# Patient Record
Sex: Female | Born: 1965
Health system: Southern US, Community
[De-identification: ages and names within clinical notes are randomized; demographics above are authoritative.]

## PROBLEM LIST (undated history)

## (undated) HISTORY — PX: THYROIDECTOMY, PARTIAL: SHX18

## (undated) HISTORY — PX: ABDOMINAL HYSTERECTOMY: SHX81

## (undated) HISTORY — PX: CHOLECYSTECTOMY: SHX55

---

## 2009-05-09 HISTORY — PX: BREAST BIOPSY: SHX20

## 2017-08-15 ENCOUNTER — Ambulatory Visit (INDEPENDENT_AMBULATORY_CARE_PROVIDER_SITE_OTHER): Payer: BLUE CROSS/BLUE SHIELD | Admitting: Physician Assistant

## 2017-08-15 ENCOUNTER — Encounter: Payer: Self-pay | Admitting: Physician Assistant

## 2017-08-15 VITALS — BP 132/86 | HR 72 | Temp 98.0°F | Resp 16 | Ht 63.0 in | Wt 166.0 lb

## 2017-08-15 DIAGNOSIS — Z1231 Encounter for screening mammogram for malignant neoplasm of breast: Secondary | ICD-10-CM

## 2017-08-15 DIAGNOSIS — Z9889 Other specified postprocedural states: Secondary | ICD-10-CM

## 2017-08-15 DIAGNOSIS — Z130 Encounter for screening for diseases of the blood and blood-forming organs and certain disorders involving the immune mechanism: Secondary | ICD-10-CM

## 2017-08-15 DIAGNOSIS — Z13220 Encounter for screening for lipoid disorders: Secondary | ICD-10-CM | POA: Diagnosis not present

## 2017-08-15 DIAGNOSIS — Z1211 Encounter for screening for malignant neoplasm of colon: Secondary | ICD-10-CM | POA: Diagnosis not present

## 2017-08-15 DIAGNOSIS — Z9009 Acquired absence of other part of head and neck: Secondary | ICD-10-CM

## 2017-08-15 DIAGNOSIS — Z1239 Encounter for other screening for malignant neoplasm of breast: Secondary | ICD-10-CM

## 2017-08-15 DIAGNOSIS — Z131 Encounter for screening for diabetes mellitus: Secondary | ICD-10-CM

## 2017-08-15 DIAGNOSIS — Z114 Encounter for screening for human immunodeficiency virus [HIV]: Secondary | ICD-10-CM | POA: Diagnosis not present

## 2017-08-15 DIAGNOSIS — E890 Postprocedural hypothyroidism: Secondary | ICD-10-CM

## 2017-08-15 DIAGNOSIS — Z0000 Encounter for general adult medical examination without abnormal findings: Secondary | ICD-10-CM | POA: Diagnosis not present

## 2017-08-15 NOTE — Progress Notes (Signed)
Patient: Pamela Chapman Female    DOB: [birth year]   52 y.o.   MRN: [id number] Visit Date: 08/16/2017  Today's Provider: Trey Sailors, PA-C   Chief Complaint  Patient presents with  . Establish Care   Subjective:    HPI   Pamela Chapman is a 52 y/o woman presenting today to establish care. She recently moved from [place], [place]. She worked previously as a Radiation protection practitioner. Now she works at [employer] in [employer]. She thinks she had tetanus shot in the past 10 years.   She had a hysterectomy in approximately 2012 for heavy menstrual cycles causing anemia. She reports her cervix was removed but not her ovaries.   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 She does not remember the name of her endocrinologist. Says she got thyroid ultrasounds once yearly by her PCP.  Her last mammogram was normal. She reports family history of breast cancer in a grandmother. She has undergone colonoscopy 2 years ago which she says had some small polyps and a bleeding ulcer.        Allergies  Allergen Reactions  . Shellfish Allergy Anaphylaxis    All Seafood   . Banana     No current outpatient medications on file.  Review of Systems  Constitutional: Positive for fatigue. Negative for activity change, appetite change, chills, diaphoresis, fever and unexpected weight change.  HENT: Negative.   Eyes: Negative.   Respiratory: Negative.   Cardiovascular: Negative.   Gastrointestinal: Negative.   Endocrine: Negative.   Genitourinary: Negative.   Musculoskeletal: Negative.   Skin: Negative.   Allergic/Immunologic: Negative.   Neurological: Negative.   Hematological: Negative.   Psychiatric/Behavioral: Negative.     Social History   Tobacco Use  . Smoking status: Never Smoker  . Smokeless tobacco: Never Used  Substance Use Topics  . Alcohol use: Never    Frequency: Never   Objective:     BP 132/86 (BP Location: Right Arm, Patient Position: Sitting, Cuff Size: Normal)   Pulse 72   Temp 98 F (36.7 C) (Oral)   Resp 16   Ht 5\' 3"  (1.6 m)   Wt 166 lb (75.3 kg)   BMI 29.41 kg/m  Vitals:   08/15/17 1034  BP: 132/86  Pulse: 72  Resp: 16  Temp: 98 F (36.7 C)  TempSrc: Oral  Weight: 166 lb (75.3 kg)  Height: 5\' 3"  (1.6 m)     Physical Exam  Constitutional: She is oriented to person, place, and time. She appears well-developed and well-nourished.  HENT:  Head: Normocephalic and atraumatic.  Right Ear: External ear normal.  Left Ear: External ear normal.  Nose: Nose normal.  Mouth/Throat: Oropharynx is clear and moist. No oropharyngeal exudate.  Eyes: Conjunctivae are normal.  Neck: Neck supple. No thyromegaly present.  Cardiovascular: Normal rate, regular rhythm and normal heart sounds.  Pulmonary/Chest: Effort normal and breath sounds normal.  Abdominal: Soft. Bowel sounds are normal. There is no tenderness.  Neurological: She is alert and oriented to person, place, and time.  Skin: Skin is warm and dry.  Psychiatric: She has a normal mood and affect. Her behavior is normal.        Assessment & Plan:     1. H/O partial thyroidectomy  Says she had noncancerous cells and got ultrasound to monitor. Will request records from PCP. F/u may be sooner than year  depending on what monitoring is required for this condition.   - TSH  2. Encounter for medical examination to establish care   3. Lipid screening  - Lipid Profile  4. Screening for deficiency anemia  - CBC with Differential  5. Breast cancer screening  Declined breast exam today, will get mammogram.  - MM Digital Screening; Future  6. Encounter for screening for HIV  - HIV antibody (with reflex)  7. Diabetes mellitus screening  - Comprehensive Metabolic Panel (CMET)  8. Colon cancer screening  Requesting records.  Return in about 1 year (around 08/16/2018).  The entirety of the  information documented in the History of Present Illness, Review of Systems and Physical Exam were personally obtained by me. Portions of this information were initially documented by Kavin LeechLaura Walsh, CMA and reviewed by me for thoroughness and accuracy.         Trey SailorsAdriana M Pollak, PA-C  [facility]

## 2017-08-15 NOTE — Patient Instructions (Signed)

## 2017-08-16 DIAGNOSIS — Z9009 Acquired absence of other part of head and neck: Secondary | ICD-10-CM | POA: Insufficient documentation

## 2017-08-16 DIAGNOSIS — E890 Postprocedural hypothyroidism: Secondary | ICD-10-CM | POA: Insufficient documentation

## 2017-09-21 ENCOUNTER — Telehealth: Payer: Self-pay

## 2017-09-21 LAB — CBC WITH DIFFERENTIAL/PLATELET
Basophils Absolute: 0.1 10*3/uL (ref 0.0–0.2)
Basos: 1 %
EOS (ABSOLUTE): 0.2 10*3/uL (ref 0.0–0.4)
Eos: 3 %
Hematocrit: 39.4 % (ref 34.0–46.6)
Hemoglobin: 12.5 g/dL (ref 11.1–15.9)
Immature Grans (Abs): 0 10*3/uL (ref 0.0–0.1)
Immature Granulocytes: 0 %
Lymphocytes Absolute: 2.8 10*3/uL (ref 0.7–3.1)
Lymphs: 46 %
MCH: 25.3 pg — ABNORMAL LOW (ref 26.6–33.0)
MCHC: 31.7 g/dL (ref 31.5–35.7)
MCV: 80 fL (ref 79–97)
Monocytes Absolute: 0.5 10*3/uL (ref 0.1–0.9)
Monocytes: 7 %
Neutrophils Absolute: 2.7 10*3/uL (ref 1.4–7.0)
Neutrophils: 43 %
Platelets: 344 10*3/uL (ref 150–379)
RBC: 4.95 x10E6/uL (ref 3.77–5.28)
RDW: 15.6 % — ABNORMAL HIGH (ref 12.3–15.4)
WBC: 6.2 10*3/uL (ref 3.4–10.8)

## 2017-09-21 LAB — LIPID PANEL
Chol/HDL Ratio: 4 ratio (ref 0.0–4.4)
Cholesterol, Total: 170 mg/dL (ref 100–199)
HDL: 42 mg/dL (ref >39)
LDL Calculated: 92 mg/dL (ref 0–99)
Triglycerides: 182 mg/dL — ABNORMAL HIGH (ref 0–149)
VLDL Cholesterol Cal: 36 mg/dL (ref 5–40)

## 2017-09-21 LAB — COMPREHENSIVE METABOLIC PANEL
ALT: 34 IU/L — ABNORMAL HIGH (ref 0–32)
AST: 31 IU/L (ref 0–40)
Albumin/Globulin Ratio: 1.7 (ref 1.2–2.2)
Albumin: 4.5 g/dL (ref 3.5–5.5)
Alkaline Phosphatase: 94 IU/L (ref 39–117)
BUN/Creatinine Ratio: 14 (ref 9–23)
BUN: 12 mg/dL (ref 6–24)
Bilirubin Total: 0.3 mg/dL (ref 0.0–1.2)
CO2: 22 mmol/L (ref 20–29)
Calcium: 9.3 mg/dL (ref 8.7–10.2)
Chloride: 103 mmol/L (ref 96–106)
Creatinine, Ser: 0.88 mg/dL (ref 0.57–1.00)
GFR calc Af Amer: 87 mL/min/{1.73_m2} (ref >59)
GFR calc non Af Amer: 75 mL/min/{1.73_m2} (ref >59)
Globulin, Total: 2.6 g/dL (ref 1.5–4.5)
Glucose: 105 mg/dL — ABNORMAL HIGH (ref 65–99)
Potassium: 4.2 mmol/L (ref 3.5–5.2)
Sodium: 142 mmol/L (ref 134–144)
Total Protein: 7.1 g/dL (ref 6.0–8.5)

## 2017-09-21 LAB — TSH: TSH: 3.08 u[IU]/mL (ref 0.450–4.500)

## 2017-09-21 LAB — HIV ANTIBODY (ROUTINE TESTING W REFLEX): HIV Screen 4th Generation wRfx: NONREACTIVE

## 2017-09-21 NOTE — Telephone Encounter (Signed)
-----   Message from Trey Sailors, [place] sent at 09/21/2017 12:21 PM EDT ----- Labs normal except for slightly high fasting blood sugar. If she would like, can add on A1c under dx code hyperglycemia. If not, will check A1c at one month follow up.

## 2017-09-21 NOTE — Telephone Encounter (Signed)
Tried calling; no answer.   Thanks,   -Shalayna Ornstein  

## 2017-09-27 NOTE — Telephone Encounter (Signed)
Tried calling; no answer.   Thanks,   -Julene Rahn  

## 2017-10-13 ENCOUNTER — Encounter: Payer: Self-pay | Admitting: Radiology

## 2017-10-13 ENCOUNTER — Ambulatory Visit
Admission: RE | Admit: 2017-10-13 | Discharge: 2017-10-13 | Disposition: A | Discharge status: Home / Self Care | Payer: BLUE CROSS/BLUE SHIELD | Source: Ambulatory Visit | Attending: Physician Assistant | Admitting: Physician Assistant

## 2017-10-13 DIAGNOSIS — Z1239 Encounter for other screening for malignant neoplasm of breast: Secondary | ICD-10-CM

## 2017-10-13 DIAGNOSIS — Z1231 Encounter for screening mammogram for malignant neoplasm of breast: Secondary | ICD-10-CM | POA: Insufficient documentation

## 2017-10-24 ENCOUNTER — Inpatient Hospital Stay
Admission: RE | Admit: 2017-10-24 | Discharge: 2017-10-24 | Disposition: A | Discharge status: Home / Self Care | Payer: Self-pay | Source: Ambulatory Visit | Attending: *Deleted | Admitting: *Deleted

## 2017-10-24 ENCOUNTER — Other Ambulatory Visit: Payer: Self-pay | Admitting: *Deleted

## 2017-10-24 DIAGNOSIS — Z9289 Personal history of other medical treatment: Secondary | ICD-10-CM

## 2018-04-10 ENCOUNTER — Other Ambulatory Visit: Payer: Self-pay

## 2018-04-10 ENCOUNTER — Encounter: Payer: Self-pay | Admitting: Family Medicine

## 2018-04-10 ENCOUNTER — Ambulatory Visit (INDEPENDENT_AMBULATORY_CARE_PROVIDER_SITE_OTHER): Payer: BLUE CROSS/BLUE SHIELD | Admitting: Family Medicine

## 2018-04-10 VITALS — BP 148/86 | HR 102 | Temp 98.4°F | Ht 63.0 in | Wt 172.2 lb

## 2018-04-10 DIAGNOSIS — J069 Acute upper respiratory infection, unspecified: Secondary | ICD-10-CM

## 2018-04-10 MED ORDER — AMOXICILLIN-POT CLAVULANATE 875-125 MG PO TABS: 1.0000 | ORAL_TABLET | Freq: Two times a day (BID) | ORAL | 0 refills | Status: DC

## 2018-04-10 NOTE — Progress Notes (Signed)
  Subjective:     Patient ID: Pamela Chapman, female   DOB: [birth year], 52 y.o.   MRN: [id number] Chief Complaint  Patient presents with  . Ear Pain    feels clogged  . sinus pressure    started 04/04/18    HPI Reports cold sx with chills. Has not started coughing No sinus drainage reported. Has used otc cold preparations.  Review of Systems     Objective:   Physical Exam  Constitutional: She appears well-developed and well-nourished. No distress.  Ears: T.M's intact without inflammation Sinuses: mild maxillary sinus tenderness Throat: no tonsillar enlargement or exudate Neck: no cervical adenopathy Lungs: clear     Assessment:    1. URI, acute    Plan:    Samples of Mucinex DM and saline irrigation. Avoid decongestants for now due to elevated bp. If sinuses not improving over the next 2-3 days to start Augmentin rx provided.

## 2018-04-10 NOTE — Patient Instructions (Signed)
Discussed use of Saline irrigation and Benadryl at night. Mucinex DM for cough. If sinuses not improving over the next 2-3 days start the antibiotic.

## 2019-04-18 ENCOUNTER — Ambulatory Visit (INDEPENDENT_AMBULATORY_CARE_PROVIDER_SITE_OTHER): Payer: Managed Care, Other (non HMO) | Admitting: Adult Health

## 2019-04-18 ENCOUNTER — Ambulatory Visit
Admission: RE | Admit: 2019-04-18 | Discharge: 2019-04-18 | Disposition: A | Discharge status: Home / Self Care | Payer: Managed Care, Other (non HMO) | Attending: Adult Health | Admitting: Adult Health

## 2019-04-18 ENCOUNTER — Other Ambulatory Visit: Payer: Self-pay

## 2019-04-18 ENCOUNTER — Ambulatory Visit
Admission: RE | Admit: 2019-04-18 | Discharge: 2019-04-18 | Disposition: A | Discharge status: Home / Self Care | Payer: Managed Care, Other (non HMO) | Source: Ambulatory Visit | Attending: Adult Health | Admitting: Adult Health

## 2019-04-18 ENCOUNTER — Encounter: Payer: Self-pay | Admitting: Adult Health

## 2019-04-18 VITALS — BP 132/78 | HR 96 | Temp 96.8°F | Resp 16 | Wt 169.2 lb

## 2019-04-18 DIAGNOSIS — M62830 Muscle spasm of back: Secondary | ICD-10-CM

## 2019-04-18 DIAGNOSIS — G5702 Lesion of sciatic nerve, left lower limb: Secondary | ICD-10-CM

## 2019-04-18 DIAGNOSIS — R5383 Other fatigue: Secondary | ICD-10-CM

## 2019-04-18 DIAGNOSIS — M5430 Sciatica, unspecified side: Secondary | ICD-10-CM | POA: Insufficient documentation

## 2019-04-18 MED ORDER — CYCLOBENZAPRINE HCL 10 MG PO TABS: 10.0000 mg | ORAL_TABLET | Freq: Every day | ORAL | 0 refills | Status: DC

## 2019-04-18 MED ORDER — PREDNISONE 10 MG (21) PO TBPK: ORAL_TABLET | ORAL | 0 refills | Status: DC

## 2019-04-18 NOTE — Progress Notes (Addendum)
Patient: Pamela Chapman Female    DOB: February 02, 1966   53 y.o.   MRN: [id number] Visit Date: 04/18/2019  Today's Provider: Jairo Ben, FNP   Chief Complaint  Patient presents with  . Leg Problem    Patient reports craming in his left leg.    Subjective:   patient is a female should state in her leg above.   Leg Pain  There was no injury mechanism. The pain is present in the left leg. The quality of the pain is described as shooting (cramping). The pain is moderate. The pain has been constant since onset. Associated symptoms include numbness (left buttock to thigh ) and tingling. Pertinent negatives include no inability to bear weight, loss of motion, loss of sensation or muscle weakness. She reports no foreign bodies present. The symptoms are aggravated by weight bearing. She has tried NSAIDs for the symptoms. The treatment provided no relief.   She has never had this problem ion the pas.   She works at [employer].She does sit a lot at times.  Denies any falls or injuries.She has been taking Tylenol and ibuprofen around. Hyland's leg cramp pills a while.  Has shooting pain from left buttock to back of knee. Does not affect her ability to walk she reports she is still working - it has affected her ability to sleep the past week.  Denies any lower back pain.  Denies any previous back injuries or back concerns.  Denies any saddle paresthesias. Denies any loss of bowel or bladder control,  Patient  denies any fever, body aches,chills, rash, chest pain, shortness of breath, nausea, vomiting, or diarrhea.      Allergies  Allergen Reactions  . Shellfish Allergy Anaphylaxis    All Seafood   . Banana     No current outpatient medications on file.  Review of Systems  Constitutional: Positive for fatigue (not sleeping at night with leg/ buttock pain ). Negative for activity change, appetite change, chills, diaphoresis, fever and unexpected weight change.  HENT:  Negative.   Respiratory: Negative.  Negative for cough and shortness of breath.   Cardiovascular: Negative.  Negative for chest pain, palpitations and leg swelling.  Endocrine:       Hot flashes- has history of thyroidectomy. She reports was not cancerous.  Hot flashes are not new. No menstrual over 1 plus years. She has no other associated symptoms and felt like this is related to menopause.   Musculoskeletal: Positive for myalgias (left upper thigh at times ). Negative for arthralgias, back pain, gait problem, joint swelling, neck pain and neck stiffness.  Skin: Negative for rash.  Neurological: Positive for tingling and numbness (left buttock to thigh ). Negative for dizziness, tremors, seizures, syncope, facial asymmetry, speech difficulty, weakness, light-headedness and headaches.  Hematological: Negative.   Psychiatric/Behavioral: Negative.     Social History   Tobacco Use  . Smoking status: Never Smoker  . Smokeless tobacco: Never Used  Substance Use Topics  . Alcohol use: Never      Objective:   BP 132/78   Pulse 96   Temp (!) 96.8 F (36 C) (Oral)   Resp 16   Wt 169 lb 3.2 oz (76.7 kg)   SpO2 98%   BMI 29.97 kg/m  Vitals:   04/18/19 1036  BP: 132/78  Pulse: 96  Resp: 16  Temp: (!) 96.8 F (36 C)  TempSrc: Oral  SpO2: 98%  Weight: 169 lb 3.2 oz (76.7  kg)  Body mass index is 29.97 kg/m.   Physical Exam Vitals reviewed.  Constitutional:      General: She is not in acute distress.    Appearance: Normal appearance. She is not ill-appearing, toxic-appearing or diaphoretic.  HENT:     Head: Normocephalic and atraumatic.     Right Ear: Tympanic membrane normal.     Left Ear: Tympanic membrane normal.  Cardiovascular:     Rate and Rhythm: Normal rate and regular rhythm.     Pulses: Normal pulses.     Heart sounds: No murmur. No friction rub. No gallop.   Pulmonary:     Effort: Pulmonary effort is normal. No respiratory distress.     Breath sounds: Normal  breath sounds. No stridor. No wheezing, rhonchi or rales.  Chest:     Chest wall: No tenderness.  Abdominal:     General: There is no distension.     Palpations: Abdomen is soft. There is no mass.     Tenderness: There is no abdominal tenderness. There is no right CVA tenderness, left CVA tenderness, guarding or rebound.     Hernia: No hernia is present.  Musculoskeletal:     Right shoulder: Normal.     Left shoulder: Normal.     Cervical back: Normal, normal range of motion and neck supple.     Thoracic back: Normal.     Lumbar back: Spasms and tenderness present. No swelling, edema, deformity, signs of trauma, lacerations or bony tenderness. Normal range of motion. Positive left straight leg raise test. Negative right straight leg raise test. No scoliosis.       Back:     Right hip: Normal. No tenderness. Normal range of motion. Normal strength.     Left hip: Normal. No tenderness. Normal range of motion. Normal strength.     Right upper leg: Normal. No swelling, edema, deformity, tenderness or bony tenderness.     Left upper leg: Normal. No swelling, edema, deformity, tenderness or bony tenderness.     Right lower leg: Normal. No edema.     Left lower leg: Normal. No edema.     Right ankle: Normal.     Left ankle: Normal.     Right foot: Normal. Normal pulse.     Left foot: Normal.     Comments: Tight lumbar sacral muscle bilateral Positive straight leg raise left leg.   Skin:    General: Skin is warm and dry.     Capillary Refill: Capillary refill takes less than 2 seconds.     Findings: No bruising, erythema or rash.     Comments: No zoster/ no rash   Neurological:     General: No focal deficit present.     Mental Status: She is alert and oriented to person, place, and time. Mental status is at baseline.     Cranial Nerves: No cranial nerve deficit.     Sensory: No sensory deficit.     Motor: No weakness.     Coordination: Coordination normal.     Gait: Gait normal.      Deep Tendon Reflexes: Reflexes normal.  Psychiatric:        Attention and Perception: Attention normal.        Mood and Affect: Mood normal.        Speech: Speech normal.        Behavior: Behavior normal.        Thought Content: Thought content normal.  Cognition and Memory: Cognition normal.        Judgment: Judgment normal.     Comments: Laughing during the exam and happy       No results found for any visits on 04/18/19.     Assessment & Plan    1. Fatigue, unspecified type Not sleeping well at night since onset of leg pain- likely related will check labs.  - Comprehensive Metabolic Panel (CMET) - Vitamin B6 - TSH - CBC  2. Sciatic leg pain Meds ordered this encounter  Medications  . predniSONE (STERAPRED UNI-PAK 21 TAB) 10 MG (21) TBPK tablet    Sig: PO: Take 6 tablets on day 1:Take 5 tablets day 2:Take 4 tablets day 3: Take 3 tablets day 4:Take 2 tablets day five: 5 Take 1 tablet day 6    Dispense:  21 tablet    Refill:  0  . cyclobenzaprine (FLEXERIL) 10 MG tablet    Sig: Take 1 tablet (10 mg total) by mouth at bedtime.    Dispense:  30 tablet    Refill:  0    - DG Lumbar Spine Complete; Future  3. Spasm of lumbar paraspinous muscle Muscle relaxer as prescribed at bedtime.  - DG Lumbar Spine Complete; Future  4. Piriformis syndrome of left side Reviewed After Visit Summary.  Activity  Do not sit for long periods. Get up and walk around every 20 minutes or as often as told by your health care provider. ? When driving long distances, make sure to take frequent stops to get up and stretch.  Use a cushion when you sit on hard surfaces.  Do exercises as told by your health care provider. Return if symptoms worsen or fail to improve, for at any time for any worsening symptoms, Go to Emergency room/ urgent care if worse.     Managing pain, stiffness, and swelling      If directed, apply heat to the affected area as often as told by your health care  provider. Use the heat source that your health care provider recommends, such as a moist heat pack or a heating pad. ? Place a towel between your skin and the heat source. ? Leave the heat on for 20-30 minutes. ? Remove the heat if your skin turns bright red. This is especially important if you are unable to feel pain, heat, or cold. You may have a greater risk of getting burned.  If directed, put ice on the injured area. ? Put ice in a plastic bag. ? Place a towel between your skin and the bag. ? Leave the ice on for 20 minutes, 2-3 times a day. General instructions  Take over-the-counter and prescription medicines only as told by your health care provider.  Ask your health care provider if the medicine prescribed to you requires you to avoid driving or using heavy machinery.  You may need to take actions to prevent or treat constipation, such as: ? Drink enough fluid to keep your urine pale yellow. ? Take over-the-counter or prescription medicines. ? Eat foods that are high in fiber, such as beans, whole grains, and fresh fruits and vegetables. ? Limit foods that are high in fat and processed sugars, such as fried or sweet foods.  Keep all follow-up visits as told by your health care provider. This is important. How is this prevented?  Do not sit for longer than 20 minutes at a time. When you sit, choose padded surfaces.  Warm up and stretch before  being active.  Cool down and stretch after being active.  Give your body time to rest between periods of activity.  Make sure to use equipment that fits you.  Maintain physical fitness, including: ? Strength. ? Flexibility. Contact a health care provider if:  Your pain and stiffness continue or get worse.  Your leg or hip becomes weak.  You have changes in your bowel function or bladder function. Summary  Piriformis syndrome is a condition that can cause pain, tingling, and numbness in your buttocks and down the back of your  leg.  You may try applying heat or ice to relieve the pain.  Do not sit for long periods. Get up and walk around every 20 minutes or as often as told by your health care provider.   Advised patient call the office or your primary care doctor for an appointment if no improvement within 72 hours or if any symptoms change or worsen at any time  Advised ER or urgent Care if after hours or on weekend. Call 911 for emergency symptoms at any time.Patinet verbalized understanding of all instructions given/reviewed and treatment plan and has no further questions or concerns at this time.    The entirety of the information documented in the History of Present Illness, Review of Systems and Physical Exam were personally obtained by me. Portions of this information were initially documented by the  Certified Medical Assistant whose name is documented in Epic and reviewed by me for thoroughness and accuracy.  I have personally performed the exam and reviewed the chart and it is accurate to the best of my knowledge.  Museum/gallery conservator has been used and any errors in dictation or transcription are unintentional.  Eula Fried. Flinchum FNP-C  [facility]  Jairo Ben, FNP  [facility]

## 2019-04-18 NOTE — Patient Instructions (Addendum)
Cyclobenzaprine tablets What is this medicine? CYCLOBENZAPRINE (sye kloe BEN za preen) is a muscle relaxer. It is used to treat muscle pain, spasms, and stiffness. This medicine may be used for other purposes; ask your health care provider or pharmacist if you have questions. COMMON BRAND NAME(S): Fexmid, Flexeril What should I tell my health care provider before I take this medicine? They need to know if you have any of these conditions:  heart disease, irregular heartbeat, or previous heart attack  liver disease  thyroid problem  an unusual or allergic reaction to cyclobenzaprine, tricyclic antidepressants, lactose, other medicines, foods, dyes, or preservatives  pregnant or trying to get pregnant  breast-feeding How should I use this medicine? Take this medicine by mouth with a glass of water. Follow the directions on the prescription label. If this medicine upsets your stomach, take it with food or milk. Take your medicine at regular intervals. Do not take it more often than directed. Talk to your pediatrician regarding the use of this medicine in children. Special care may be needed. Overdosage: If you think you have taken too much of this medicine contact a poison control center or emergency room at once. NOTE: This medicine is only for you. Do not share this medicine with others. What if I miss a dose? If you miss a dose, take it as soon as you can. If it is almost time for your next dose, take only that dose. Do not take double or extra doses. What may interact with this medicine? Do not take this medicine with any of the following medications:  MAOIs like Carbex, Eldepryl, Marplan, Nardil, and Parnate  narcotic medicines for cough  safinamide This medicine may also interact with the following medications:  alcohol  bupropion  antihistamines for allergy, cough and cold  certain medicines for anxiety or sleep  certain medicines for bladder problems like oxybutynin,  tolterodine  certain medicines for depression like amitriptyline, fluoxetine, sertraline  certain medicines for Parkinson's disease like benztropine, trihexyphenidyl  certain medicines for seizures like phenobarbital, primidone  certain medicines for stomach problems like dicyclomine, hyoscyamine  certain medicines for travel sickness like scopolamine  general anesthetics like halothane, isoflurane, methoxyflurane, propofol  ipratropium  local anesthetics like lidocaine, pramoxine, tetracaine  medicines that relax muscles for surgery  narcotic medicines for pain  phenothiazines like chlorpromazine, mesoridazine, prochlorperazine, thioridazine  verapamil This list may not describe all possible interactions. Give your health care provider a list of all the medicines, herbs, non-prescription drugs, or dietary supplements you use. Also tell them if you smoke, drink alcohol, or use illegal drugs. Some items may interact with your medicine. What should I watch for while using this medicine? Tell your doctor or health care professional if your symptoms do not start to get better or if they get worse. You may get drowsy or dizzy. Do not drive, use machinery, or do anything that needs mental alertness until you know how this medicine affects you. Do not stand or sit up quickly, especially if you are an older patient. This reduces the risk of dizzy or fainting spells. Alcohol may interfere with the effect of this medicine. Avoid alcoholic drinks. If you are taking another medicine that also causes drowsiness, you may have more side effects. Give your health care provider a list of all medicines you use. Your doctor will tell you how much medicine to take. Do not take more medicine than directed. Call emergency for help if you have problems breathing or unusual sleepiness.  Your mouth may get dry. Chewing sugarless gum or sucking hard candy, and drinking plenty of water may help. Contact your  doctor if the problem does not go away or is severe. What side effects may I notice from receiving this medicine? Side effects that you should report to your doctor or health care professional as soon as possible:  allergic reactions like skin rash, itching or hives, swelling of the face, lips, or tongue  breathing problems  chest pain  fast, irregular heartbeat  hallucinations  seizures  unusually weak or tired Side effects that usually do not require medical attention (report to your doctor or health care professional if they continue or are bothersome):  headache  nausea, vomiting This list may not describe all possible side effects. Call your doctor for medical advice about side effects. You may report side effects to FDA at 1-800-FDA-1088. Where should I keep my medicine? Keep out of the reach of children. Store at room temperature between 15 and 30 degrees C (59 and 86 degrees F). Keep container tightly closed. Throw away any unused medicine after the expiration date. NOTE: This sheet is a summary. It may not cover all possible information. If you have questions about this medicine, talk to your doctor, pharmacist, or health care provider.  2020 Elsevier/Gold Standard (2018-03-28 12:49:26) Prednisone tablets What is this medicine? PREDNISONE (PRED ni sone) is a corticosteroid. It is commonly used to treat inflammation of the skin, joints, lungs, and other organs. Common conditions treated include asthma, allergies, and arthritis. It is also used for other conditions, such as blood disorders and diseases of the adrenal glands. This medicine may be used for other purposes; ask your health care provider or pharmacist if you have questions. COMMON BRAND NAME(S): Deltasone, Predone, Sterapred, Sterapred DS What should I tell my health care provider before I take this medicine? They need to know if you have any of these conditions:  Cushing's  syndrome  diabetes  glaucoma  heart disease  high blood pressure  infection (especially a virus infection such as chickenpox, cold sores, or herpes)  kidney disease  liver disease  mental illness  myasthenia gravis  osteoporosis  seizures  stomach or intestine problems  thyroid disease  an unusual or allergic reaction to lactose, prednisone, other medicines, foods, dyes, or preservatives  pregnant or trying to get pregnant  breast-feeding How should I use this medicine? Take this medicine by mouth with a glass of water. Follow the directions on the prescription label. Take this medicine with food. If you are taking this medicine once a day, take it in the morning. Do not take more medicine than you are told to take. Do not suddenly stop taking your medicine because you may develop a severe reaction. Your doctor will tell you how much medicine to take. If your doctor wants you to stop the medicine, the dose may be slowly lowered over time to avoid any side effects. Talk to your pediatrician regarding the use of this medicine in children. Special care may be needed. Overdosage: If you think you have taken too much of this medicine contact a poison control center or emergency room at once. NOTE: This medicine is only for you. Do not share this medicine with others. What if I miss a dose? If you miss a dose, take it as soon as you can. If it is almost time for your next dose, talk to your doctor or health care professional. Dennis Bast may need to miss a dose  or take an extra dose. Do not take double or extra doses without advice. What may interact with this medicine? Do not take this medicine with any of the following medications:  metyrapone  mifepristone This medicine may also interact with the following medications:  aminoglutethimide  amphotericin B  aspirin and aspirin-like medicines  barbiturates  certain medicines for diabetes, like glipizide or  glyburide  cholestyramine  cholinesterase inhibitors  cyclosporine  digoxin  diuretics  ephedrine  female hormones, like estrogens and birth control pills  isoniazid  ketoconazole  NSAIDS, medicines for pain and inflammation, like ibuprofen or naproxen  phenytoin  rifampin  toxoids  vaccines  warfarin This list may not describe all possible interactions. Give your health care provider a list of all the medicines, herbs, non-prescription drugs, or dietary supplements you use. Also tell them if you smoke, drink alcohol, or use illegal drugs. Some items may interact with your medicine. What should I watch for while using this medicine? Visit your doctor or health care professional for regular checks on your progress. If you are taking this medicine over a prolonged period, carry an identification card with your name and address, the type and dose of your medicine, and your doctor's name and address. This medicine may increase your risk of getting an infection. Tell your doctor or health care professional if you are around anyone with measles or chickenpox, or if you develop sores or blisters that do not heal properly. If you are going to have surgery, tell your doctor or health care professional that you have taken this medicine within the last twelve months. Ask your doctor or health care professional about your diet. You may need to lower the amount of salt you eat. This medicine may increase blood sugar. Ask your healthcare provider if changes in diet or medicines are needed if you have diabetes. What side effects may I notice from receiving this medicine? Side effects that you should report to your doctor or health care professional as soon as possible:  allergic reactions like skin rash, itching or hives, swelling of the face, lips, or tongue  changes in emotions or moods  changes in vision  depressed mood  eye pain  fever or chills, cough, sore throat, pain or  difficulty passing urine  signs and symptoms of high blood sugar such as being more thirsty or hungry or having to urinate more than normal. You may also feel very tired or have blurry vision.  swelling of ankles, feet Side effects that usually do not require medical attention (report to your doctor or health care professional if they continue or are bothersome):  confusion, excitement, restlessness  headache  nausea, vomiting  skin problems, acne, thin and shiny skin  trouble sleeping  weight gain This list may not describe all possible side effects. Call your doctor for medical advice about side effects. You may report side effects to FDA at 1-800-FDA-1088. Where should I keep my medicine? Keep out of the reach of children. Store at room temperature between 15 and 30 degrees C (59 and 86 degrees F). Protect from light. Keep container tightly closed. Throw away any unused medicine after the expiration date. NOTE: This sheet is a summary. It may not cover all possible information. If you have questions about this medicine, talk to your doctor, pharmacist, or health care provider.  2020 Elsevier/Gold Standard (2018-01-23 10:54:22) Sciatica  Sciatica is pain, weakness, tingling, or loss of feeling (numbness) along the sciatic nerve. The sciatic nerve starts  in the lower back and goes down the back of each leg. Sciatica usually goes away on its own or with treatment. Sometimes, sciatica may come back (recur). What are the causes? This condition happens when the sciatic nerve is pinched or has pressure put on it. This may be the result of:  A disk in between the bones of the spine bulging out too far (herniated disk).  Changes in the spinal disks that occur with aging.  A condition that affects a muscle in the butt.  Extra bone growth near the sciatic nerve.  A break (fracture) of the area between your hip bones (pelvis).  Pregnancy.  Tumor. This is rare. What increases the  risk? You are more likely to develop this condition if you:  Play sports that put pressure or stress on the spine.  Have poor strength and ease of movement (flexibility).  Have had a back injury in the past.  Have had back surgery.  Sit for long periods of time.  Do activities that involve bending or lifting over and over again.  Are very overweight (obese). What are the signs or symptoms? Symptoms can vary from mild to very bad. They may include:  Any of these problems in the lower back, leg, hip, or butt: ? Mild tingling, loss of feeling, or dull aches. ? Burning sensations. ? Sharp pains.  Loss of feeling in the back of the calf or the sole of the foot.  Leg weakness.  Very bad back pain that makes it hard to move. These symptoms may get worse when you cough, sneeze, or laugh. They may also get worse when you sit or stand for long periods of time. How is this treated? This condition often gets better without any treatment. However, treatment may include:  Changing or cutting back on physical activity when you have pain.  Doing exercises and stretching.  Putting ice or heat on the affected area.  Medicines that help: ? To relieve pain and swelling. ? To relax your muscles.  Shots (injections) of medicines that help to relieve pain, irritation, and swelling.  Surgery. Follow these instructions at home: Medicines  Take over-the-counter and prescription medicines only as told by your doctor.  Ask your doctor if the medicine prescribed to you: ? Requires you to avoid driving or using heavy machinery. ? Can cause trouble pooping (constipation). You may need to take these steps to prevent or treat trouble pooping:  Drink enough fluids to keep your pee (urine) pale yellow.  Take over-the-counter or prescription medicines.  Eat foods that are high in fiber. These include beans, whole grains, and fresh fruits and vegetables.  Limit foods that are high in fat and  sugar. These include fried or sweet foods. Managing pain      If told, put ice on the affected area. ? Put ice in a plastic bag. ? Place a towel between your skin and the bag. ? Leave the ice on for 20 minutes, 2-3 times a day.  If told, put heat on the affected area. Use the heat source that your doctor tells you to use, such as a moist heat pack or a heating pad. ? Place a towel between your skin and the heat source. ? Leave the heat on for 20-30 minutes. ? Remove the heat if your skin turns bright red. This is very important if you are unable to feel pain, heat, or cold. You may have a greater risk of getting burned. Activity  Return to your normal activities as told by your doctor. Ask your doctor what activities are safe for you.  Avoid activities that make your symptoms worse.  Take short rests during the day. ? When you rest for a long time, do some physical activity or stretching between periods of rest. ? Avoid sitting for a long time without moving. Get up and move around at least one time each hour.  Exercise and stretch regularly, as told by your doctor.  Do not lift anything that is heavier than 10 lb (4.5 kg) while you have symptoms of sciatica. ? Avoid lifting heavy things even when you do not have symptoms. ? Avoid lifting heavy things over and over.  When you lift objects, always lift in a way that is safe for your body. To do this, you should: ? Bend your knees. ? Keep the object close to your body. ? Avoid twisting. General instructions  Stay at a healthy weight.  Wear comfortable shoes that support your feet. Avoid wearing high heels.  Avoid sleeping on a mattress that is too soft or too hard. You might have less pain if you sleep on a mattress that is firm enough to support your back.  Keep all follow-up visits as told by your doctor. This is important. Contact a doctor if:  You have pain that: ? Wakes you up when you are sleeping. ? Gets worse  when you lie down. ? Is worse than the pain you have had in the past. ? Lasts longer than 4 weeks.  You lose weight without trying. Get help right away if:  You cannot control when you pee (urinate) or poop (have a bowel movement).  You have weakness in any of these areas and it gets worse: ? Lower back. ? The area between your hip bones. ? Butt. ? Legs.  You have redness or swelling of your back.  You have a burning feeling when you pee. Summary  Sciatica is pain, weakness, tingling, or loss of feeling (numbness) along the sciatic nerve.  This condition happens when the sciatic nerve is pinched or has pressure put on it.  Sciatica can cause pain, tingling, or loss of feeling (numbness) in the lower back, legs, hips, and butt.  Treatment often includes rest, exercise, medicines, and putting ice or heat on the affected area. This information is not intended to replace advice given to you by your health care provider. Make sure you discuss any questions you have with your health care provider. Document Released: 02/02/2008 Document Revised: 05/14/2018 Document Reviewed: 05/14/2018 Elsevier Patient Education  2020 [employer] Inc.  Piriformis Syndrome  Piriformis syndrome is a condition that can cause pain and numbness in your buttocks and down the back of your leg. Piriformis syndrome happens when the small muscle that connects the base of your spine to your hip (piriformis muscle) presses on the nerve that runs down the back of your leg (sciatic nerve). The piriformis muscle helps your hip rotate and helps to bring your leg back and out. It also helps shift your weight to keep you stable while you are walking. The sciatic nerve runs under or through the piriformis muscle. Damage to the piriformis muscle can cause spasms that put pressure on the nerve below. This causes pain and discomfort while sitting and moving. The pain may feel as if it begins in the buttock and spreads (radiates)  down your hip and thigh. What are the causes? This condition is caused by pressure on  the sciatic nerve from the piriformis muscle. The piriformis muscle can get irritated with overuse, especially if other hip muscles are weak and the piriformis muscle has to do extra work. Piriformis syndrome can also occur after an injury, like a fall onto your buttocks. What increases the risk? You are more likely to develop this condition if you:  Are a woman.  Sit for long periods of time.  Are a cyclist.  Have weak buttocks muscles (gluteal muscles). What are the signs or symptoms? Symptoms of this condition include:  Pain, tingling, or numbness that starts in the buttock and runs down the back of your leg (sciatica).  Pain in the groin or thigh area. Your symptoms may get worse:  The longer you sit.  When you walk, run, or climb stairs.  When straining to have a bowel movement. How is this diagnosed? This condition is diagnosed based on your symptoms, medical history, and physical exam.  During the exam, your health care provider may: ? Move your leg into different positions to check for pain. ? Press on the muscles of your hip and buttock to see if that increases your symptoms.  You may also have tests, including: ? Imaging tests such as X-rays, MRI, or ultrasound. ? Electromyogram (EMG). This test measures electrical signals sent by your nerves into the muscles. ? Nerve conduction study. This test measures how well electrical signals pass through your nerves. How is this treated? This condition may be treated by:  Stopping all activities that cause pain or make your condition worse.  Applying ice or using heat therapy.  Taking medicines to reduce pain and swelling.  Taking a muscle relaxer (muscle relaxant) to stop muscle spasms.  Doing range-of-motion and strengthening exercises (physical therapy) as told by your health care provider.  Massaging the area.  Having  acupuncture.  Getting an injection of medicine in the piriformis muscle. Your health care provider will choose the medicine based on your condition. He or she may inject: ? An anti-inflammatory medicine (steroid) to reduce swelling. ? A numbing medicine (local anesthetic) to block the pain. ? Botulinum toxin. The toxin blocks nerve impulses to specific muscles to reduce muscle tension. In rare cases, you may need surgery to cut the muscle and release pressure on the nerve if other treatments do not work. Follow these instructions at home: Activity  Do not sit for long periods. Get up and walk around every 20 minutes or as often as told by your health care provider. ? When driving long distances, make sure to take frequent stops to get up and stretch.  Use a cushion when you sit on hard surfaces.  Do exercises as told by your health care provider.  Return to your normal activities as told by your health care provider. Ask your health care provider what activities are safe for you. Managing pain, stiffness, and swelling      If directed, apply heat to the affected area as often as told by your health care provider. Use the heat source that your health care provider recommends, such as a moist heat pack or a heating pad. ? Place a towel between your skin and the heat source. ? Leave the heat on for 20-30 minutes. ? Remove the heat if your skin turns bright red. This is especially important if you are unable to feel pain, heat, or cold. You may have a greater risk of getting burned.  If directed, put ice on the injured area. ? Put  ice in a plastic bag. ? Place a towel between your skin and the bag. ? Leave the ice on for 20 minutes, 2-3 times a day. General instructions  Take over-the-counter and prescription medicines only as told by your health care provider.  Ask your health care provider if the medicine prescribed to you requires you to avoid driving or using heavy  machinery.  You may need to take actions to prevent or treat constipation, such as: ? Drink enough fluid to keep your urine pale yellow. ? Take over-the-counter or prescription medicines. ? Eat foods that are high in fiber, such as beans, whole grains, and fresh fruits and vegetables. ? Limit foods that are high in fat and processed sugars, such as fried or sweet foods.  Keep all follow-up visits as told by your health care provider. This is important. How is this prevented?  Do not sit for longer than 20 minutes at a time. When you sit, choose padded surfaces.  Warm up and stretch before being active.  Cool down and stretch after being active.  Give your body time to rest between periods of activity.  Make sure to use equipment that fits you.  Maintain physical fitness, including: ? Strength. ? Flexibility. Contact a health care provider if:  Your pain and stiffness continue or get worse.  Your leg or hip becomes weak.  You have changes in your bowel function or bladder function. Summary  Piriformis syndrome is a condition that can cause pain, tingling, and numbness in your buttocks and down the back of your leg.  You may try applying heat or ice to relieve the pain.  Do not sit for long periods. Get up and walk around every 20 minutes or as often as told by your health care provider. This information is not intended to replace advice given to you by your health care provider. Make sure you discuss any questions you have with your health care provider. Document Released: 04/25/2005 Document Revised: 08/16/2018 Document Reviewed: 12/20/2017 Elsevier Patient Education  2020 [employer]

## 2019-04-19 ENCOUNTER — Telehealth: Payer: Self-pay

## 2019-04-19 LAB — COMPREHENSIVE METABOLIC PANEL
AST: 23 IU/L (ref 0–40)
Albumin: 4.5 g/dL (ref 3.8–4.9)
Calcium: 9.3 mg/dL (ref 8.7–10.2)

## 2019-04-19 LAB — CBC
Platelets: 328 10*3/uL (ref 150–450)
RBC: 4.88 x10E6/uL (ref 3.77–5.28)
WBC: 6.7 10*3/uL (ref 3.4–10.8)

## 2019-04-19 LAB — VITAMIN B6

## 2019-04-19 NOTE — Telephone Encounter (Signed)
Patient has been advised. KW 

## 2019-04-19 NOTE — Telephone Encounter (Signed)
-----   Message from Doreen Beam, [place] sent at 04/19/2019  8:31 AM EST ----- Please let me know if she has any questions,and inform her of the below:   Labs are all within normal variation limits. Electrolytes are normal. Blood glucose is elevated - she however was not fasting.  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  Facet hypertrophy most commonly occurs in the lumbar region, where it can cause joints to protrude into the spinal canal and place pressure on the spinal cord and nerve roots. Treatment can be conservative with physical therapy exercises, possibly orthopedic facet injections with steroids and other conservative treatments. Emergent signs to seek care in the emergency room would be groin numbness, moderate to severe back pain, worsening leg pain, and any loss of bowel or bladder control.   She should continue the treatment as discussed in office- report any worsening symptoms and return to the office in 1-2 weeks to discuss the next step depending on her exam and how she is doing. After she finishes the prednisone I suggest she start Aleve per package instructions daily. If she has any worsening symptoms after hours she should seek care at emergency room. Also [facility] [place] has walk in hours Monday to Friday 1pm to 7pm should symptoms not be improving.

## 2019-04-19 NOTE — Progress Notes (Signed)
Labs are all within normal variation limits. Electrolytes are normal. Blood glucose is elevated - she however was not fasting.  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  Facet hypertrophy most commonly occurs in the lumbar region, where it can cause joints to protrude into the spinal canal and place pressure on the spinal cord and nerve roots. Treatment can be conservative with physical therapy exercises, possibly orthopedic facet injections with steroids and other conservative treatments.  Emergent signs to seek care in the emergency room would be groin numbness, moderate to severe back pain, worsening leg pain, and any loss of bowel or bladder control.   She should continue the treatment as discussed  in office- report any worsening symptoms and return to the office in 1-2 weeks to discuss the next step depending on her exam and how she is doing. After she finishes the prednisone I suggest she start Aleve per package instructions daily. If she has any worsening symptoms after hours she should seek care at emergency room. Also [facility] [place] has walk in hours Monday to Friday 1pm to 7pm should symptoms not be improving.

## 2019-04-26 LAB — COMPREHENSIVE METABOLIC PANEL
ALT: 27 IU/L (ref 0–32)
Albumin/Globulin Ratio: 1.7 (ref 1.2–2.2)
Alkaline Phosphatase: 103 IU/L (ref 39–117)
BUN/Creatinine Ratio: 16 (ref 9–23)
BUN: 14 mg/dL (ref 6–24)
Bilirubin Total: <0.2 mg/dL (ref 0.0–1.2)
CO2: 19 mmol/L — ABNORMAL LOW (ref 20–29)
Chloride: 104 mmol/L (ref 96–106)
Creatinine, Ser: 0.86 mg/dL (ref 0.57–1.00)
GFR calc Af Amer: 89 mL/min/{1.73_m2} (ref >59)
GFR calc non Af Amer: 77 mL/min/{1.73_m2} (ref >59)
Globulin, Total: 2.7 g/dL (ref 1.5–4.5)
Glucose: 116 mg/dL — ABNORMAL HIGH (ref 65–99)
Potassium: 4.4 mmol/L (ref 3.5–5.2)
Sodium: 138 mmol/L (ref 134–144)
Total Protein: 7.2 g/dL (ref 6.0–8.5)

## 2019-04-26 LAB — CBC
Hematocrit: 38.2 % (ref 34.0–46.6)
Hemoglobin: 12.8 g/dL (ref 11.1–15.9)
MCH: 26.2 pg — ABNORMAL LOW (ref 26.6–33.0)
MCHC: 33.5 g/dL (ref 31.5–35.7)
MCV: 78 fL — ABNORMAL LOW (ref 79–97)
RDW: 14.2 % (ref 11.7–15.4)

## 2019-04-26 LAB — TSH: TSH: 2.16 u[IU]/mL (ref 0.450–4.500)

## 2019-05-15 ENCOUNTER — Encounter: Payer: Self-pay | Admitting: Physician Assistant

## 2019-05-15 ENCOUNTER — Ambulatory Visit: Payer: Managed Care, Other (non HMO) | Admitting: Adult Health

## 2019-05-15 ENCOUNTER — Ambulatory Visit (INDEPENDENT_AMBULATORY_CARE_PROVIDER_SITE_OTHER): Payer: BC Managed Care – PPO | Admitting: Physician Assistant

## 2019-05-15 ENCOUNTER — Other Ambulatory Visit: Payer: Self-pay

## 2019-05-15 VITALS — BP 145/94 | HR 99 | Temp 95.9°F | Wt 172.2 lb

## 2019-05-15 DIAGNOSIS — M5416 Radiculopathy, lumbar region: Secondary | ICD-10-CM | POA: Diagnosis not present

## 2019-05-15 MED ORDER — GABAPENTIN 300 MG PO CAPS: 300.0000 mg | ORAL_CAPSULE | Freq: Three times a day (TID) | ORAL | 1 refills | Status: DC

## 2019-05-15 NOTE — Patient Instructions (Signed)
Radicular Pain Radicular pain is a type of pain that spreads from your back or neck along a spinal nerve. Spinal nerves are nerves that leave the spinal cord and go to the muscles. Radicular pain is sometimes called radiculopathy, radiculitis, or a pinched nerve. When you have this type of pain, you may also have weakness, numbness, or tingling in the area of your body that is supplied by the nerve. The pain may feel sharp and burning. Depending on which spinal nerve is affected, the pain may occur in the:  Neck area (cervical radicular pain). You may also feel pain, numbness, weakness, or tingling in the arms.  Mid-spine area (thoracic radicular pain). You would feel this pain in the back and chest. This type is rare.  Lower back area (lumbar radicular pain). You would feel this pain as low back pain. You may feel pain, numbness, weakness, or tingling in the buttocks or legs. Sciatica is a type of lumbar radicular pain that shoots down the back of the leg. Radicular pain occurs when one of the spinal nerves becomes irritated or squeezed (compressed). It is often caused by something pushing on a spinal nerve, such as one of the bones of the spine (vertebrae) or one of the round cushions between vertebrae (intervertebral disks). This can result from:  An injury.  Wear and tear or aging of a disk.  The growth of a bone spur that pushes on the nerve. Radicular pain often goes away when you follow instructions from your health care provider for relieving pain at home. Follow these instructions at home: Managing pain      If directed, put ice on the affected area: ? Put ice in a plastic bag. ? Place a towel between your skin and the bag. ? Leave the ice on for 20 minutes, 2-3 times a day.  If directed, apply heat to the affected area as often as told by your health care provider. Use the heat source that your health care provider recommends, such as a moist heat pack or a heating pad. ? Place  a towel between your skin and the heat source. ? Leave the heat on for 20-30 minutes. ? Remove the heat if your skin turns bright red. This is especially important if you are unable to feel pain, heat, or cold. You may have a greater risk of getting burned. Activity   Do not sit or rest in bed for long periods of time.  Try to stay as active as possible. Ask your health care provider what type of exercise or activity is best for you.  Avoid activities that make your pain worse, such as bending and lifting.  Do not lift anything that is heavier than 10 lb (4.5 kg), or the limit that you are told, until your health care provider says that it is safe.  Practice using proper technique when lifting items. Proper lifting technique involves bending your knees and rising up.  Do strength and range-of-motion exercises only as told by your health care provider or physical therapist. General instructions  Take over-the-counter and prescription medicines only as told by your health care provider.  Pay attention to any changes in your symptoms.  Keep all follow-up visits as told by your health care provider. This is important. ? Your health care provider may send you to a physical therapist to help with this pain. Contact a health care provider if:  Your pain and other symptoms get worse.  Your pain medicine is not   helping.  Your pain has not improved after a few weeks of home care.  You have a fever. Get help right away if:  You have severe pain, weakness, or numbness.  You have difficulty with bladder or bowel control. Summary  Radicular pain is a type of pain that spreads from your back or neck along a spinal nerve.  When you have radicular pain, you may also have weakness, numbness, or tingling in the area of your body that is supplied by the nerve.  The pain may feel sharp or burning.  Radicular pain may be treated with ice, heat, medicines, or physical therapy. This  information is not intended to replace advice given to you by your health care provider. Make sure you discuss any questions you have with your health care provider. Document Revised: 11/07/2017 Document Reviewed: 11/07/2017 Elsevier Patient Education  2020 Elsevier Inc.  

## 2019-05-15 NOTE — Progress Notes (Signed)
Patient: Pamela Chapman Female    DOB: 1966/03/10   54 y.o.   MRN: [id number] Visit Date: 05/15/2019  Today's Provider: Trinna Post, PA-C   Chief Complaint  Patient presents with  . Leg Pain   Subjective:    I, Pamela Chapman,CMA am acting as a Education administrator for [employer].  Leg Pain  The incident occurred more than 1 week ago. There was no injury mechanism. The pain is present in the left leg. The quality of the pain is described as stabbing and shooting. The pain is at a severity of 6/10 (If do not taking medication pain level is 10.). The pain is severe. The pain has been constant since onset. Associated symptoms include an inability to bear weight (sometimes per pt) and tingling. Pertinent negatives include no loss of motion, loss of sensation, muscle weakness or numbness. She reports no foreign bodies present. The symptoms are aggravated by movement and weight bearing. She has tried acetaminophen, NSAIDs, heat and ice for the symptoms. The treatment provided mild relief.  Patient reports that the pain keeps her up at night. Was seen for the same on 04/18/2019 by another provider in this clinic and was prescribed a 6 day prednisone taper and flexeril. Reports prednisone helps slightly. Lumbar xray from previous visit shows multilevel DDD and facet hypertrophy.   Wt Readings from Last 3 Encounters:  05/15/19 172 lb 3.2 oz (78.1 kg)  04/18/19 169 lb 3.2 oz (76.7 kg)  04/10/18 172 lb 3.2 oz (78.1 kg)      Allergies  Allergen Reactions  . Shellfish Allergy Anaphylaxis    All Seafood   . Banana      Current Outpatient Medications:  .  cyclobenzaprine (FLEXERIL) 10 MG tablet, Take 1 tablet (10 mg total) by mouth at bedtime. (Patient not taking: Reported on 05/15/2019), Disp: 30 tablet, Rfl: 0 .  predniSONE (STERAPRED UNI-PAK 21 TAB) 10 MG (21) TBPK tablet, PO: Take 6 tablets on day 1:Take 5 tablets day 2:Take 4 tablets day 3: Take 3 tablets day 4:Take 2 tablets  day five: 5 Take 1 tablet day 6 (Patient not taking: Reported on 05/15/2019), Disp: 21 tablet, Rfl: 0  Review of Systems  Constitutional: Negative.   Cardiovascular: Negative for leg swelling.  Gastrointestinal: Negative.   Musculoskeletal: Negative.   Neurological: Positive for tingling. Negative for numbness.    Social History   Tobacco Use  . Smoking status: Never Smoker  . Smokeless tobacco: Never Used  Substance Use Topics  . Alcohol use: Never      Objective:   There were no vitals taken for this visit. There were no vitals filed for this visit.There is no height or weight on file to calculate BMI.   Physical Exam Constitutional:      Appearance: Normal appearance.  Cardiovascular:     Rate and Rhythm: Normal rate.  Pulmonary:     Effort: Pulmonary effort is normal.  Musculoskeletal:     Right lower leg: No edema.     Left lower leg: No edema.       Legs:  Skin:    General: Skin is warm and dry.  Neurological:     General: No focal deficit present.     Mental Status: She is alert.  Psychiatric:        Mood and Affect: Mood normal.        Behavior: Behavior normal.      No results found for any  visits on 05/15/19.     Assessment & Plan    1. Lumbar radiculopathy  Start gabapentin as below. Explained potential chronic nature and various interventions. Will refer to PT and also physiatry for possible injection.   - AMB referral to orthopedics - gabapentin (NEURONTIN) 300 MG capsule; Take 1 capsule (300 mg total) by mouth 3 (three) times daily.  Dispense: 90 capsule; Refill: 1 - Ambulatory referral to Physical Therapy      Trey Sailors, PA-C  [facility]

## 2019-05-28 ENCOUNTER — Ambulatory Visit: Payer: BC Managed Care – PPO

## 2019-05-31 ENCOUNTER — Other Ambulatory Visit: Payer: Self-pay | Admitting: Physical Medicine and Rehabilitation

## 2019-05-31 DIAGNOSIS — M5416 Radiculopathy, lumbar region: Secondary | ICD-10-CM

## 2019-06-10 ENCOUNTER — Ambulatory Visit
Admission: RE | Admit: 2019-06-10 | Discharge: 2019-06-10 | Disposition: A | Discharge status: Home / Self Care | Payer: BC Managed Care – PPO | Source: Ambulatory Visit | Attending: Physical Medicine and Rehabilitation | Admitting: Physical Medicine and Rehabilitation

## 2019-06-10 ENCOUNTER — Other Ambulatory Visit: Payer: Self-pay

## 2019-06-10 DIAGNOSIS — M5416 Radiculopathy, lumbar region: Secondary | ICD-10-CM | POA: Diagnosis present

## 2019-08-24 ENCOUNTER — Other Ambulatory Visit: Payer: Self-pay | Admitting: Physician Assistant

## 2019-08-24 DIAGNOSIS — M5416 Radiculopathy, lumbar region: Secondary | ICD-10-CM

## 2019-10-15 ENCOUNTER — Other Ambulatory Visit: Payer: Self-pay | Admitting: Physician Assistant

## 2019-10-15 ENCOUNTER — Ambulatory Visit (INDEPENDENT_AMBULATORY_CARE_PROVIDER_SITE_OTHER): Payer: BC Managed Care – PPO | Admitting: Physician Assistant

## 2019-10-15 ENCOUNTER — Encounter: Payer: Self-pay | Admitting: Physician Assistant

## 2019-10-15 ENCOUNTER — Other Ambulatory Visit: Payer: Self-pay

## 2019-10-15 VITALS — BP 138/88 | HR 86 | Temp 95.9°F | Ht 62.0 in | Wt 168.8 lb

## 2019-10-15 DIAGNOSIS — N951 Menopausal and female climacteric states: Secondary | ICD-10-CM

## 2019-10-15 DIAGNOSIS — Z1211 Encounter for screening for malignant neoplasm of colon: Secondary | ICD-10-CM | POA: Diagnosis not present

## 2019-10-15 DIAGNOSIS — Z0000 Encounter for general adult medical examination without abnormal findings: Secondary | ICD-10-CM | POA: Diagnosis not present

## 2019-10-15 DIAGNOSIS — Z1231 Encounter for screening mammogram for malignant neoplasm of breast: Secondary | ICD-10-CM | POA: Diagnosis not present

## 2019-10-15 DIAGNOSIS — Z9009 Acquired absence of other part of head and neck: Secondary | ICD-10-CM

## 2019-10-15 DIAGNOSIS — E049 Nontoxic goiter, unspecified: Secondary | ICD-10-CM

## 2019-10-15 DIAGNOSIS — M5416 Radiculopathy, lumbar region: Secondary | ICD-10-CM

## 2019-10-15 DIAGNOSIS — E890 Postprocedural hypothyroidism: Secondary | ICD-10-CM

## 2019-10-15 NOTE — Progress Notes (Signed)
Complete physical exam   Patient: Pamela Chapman   DOB: December 26, 1965   54 y.o. Female  MRN: [id number] Visit Date: 10/15/2019  Today's healthcare provider: Trinna Post, PA-C   Chief Complaint  Patient presents with  . Annual Exam   Subjective    Pamela Chapman is a 54 y.o. female who presents today for a complete physical exam.  She reports consuming a general diet. walking She generally feels well. She reports sleeping well. She does not have additional problems to discuss today.  HPI   BP Readings from Last 3 Encounters:  10/15/19 138/88  05/15/19 (!) 145/94  04/18/19 132/78   Due for mammogram, ordered today.  Due for colon cancer screening, history of colonoscopy with polyps.   History of hysterectomy, not due for PAP.   Not yet vaccinated for COVID. Due for tetanus shot.   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 Was unable to get records. Patient reports she historically has had ultrasounds of her right thyroid. She reports her thyroid is larger and occasionally she will experience hoarseness. She reports difficulty losing weight. Reports she stays hot all the time.   She continues with once nightly gabapentin for her back. She was seen by [facility] and had lumbar MRI which showed mild L4-L5 and L5-S1 disc bulging with canal stenosis.  No past medical history on file. Past Surgical History:  Procedure Laterality Date  . BREAST BIOPSY Left 2011   neg bx  . CHOLECYSTECTOMY    . THYROIDECTOMY, PARTIAL Left    Social History   Socioeconomic History  . Marital status: Divorced    Spouse name: Not on file  . Number of children: Not on file  . Years of education: Not on file  . Highest education level: Not on file  Occupational History  . Not on file  Tobacco Use  . Smoking status: Never Smoker  . Smokeless tobacco: Never Used  Substance and Sexual Activity  . Alcohol use: Never  . Drug use:  Never  . Sexual activity: Not on file  Other Topics Concern  . Not on file  Social History Narrative  . Not on file   Social Determinants of Health   Financial Resource Strain:   . Difficulty of Paying Living Expenses:   Food Insecurity:   . Worried About Charity fundraiser in the Last Year:   . Arboriculturist in the Last Year:   Transportation Needs:   . Film/video editor (Medical):   Marland Kitchen Lack of Transportation (Non-Medical):   Physical Activity:   . Days of Exercise per Week:   . Minutes of Exercise per Session:   Stress:   . Feeling of Stress :   Social Connections:   . Frequency of Communication with Friends and Family:   . Frequency of Social Gatherings with Friends and Family:   . Attends Religious Services:   . Active Member of Clubs or Organizations:   . Attends Archivist Meetings:   Marland Kitchen Marital Status:   Intimate Partner Violence:   . Fear of Current or Ex-Partner:   . Emotionally Abused:   Marland Kitchen Physically Abused:   . Sexually Abused:    Family Status  Relation Name Status  . Mother  Alive  . Father  Alive  . MGM  Deceased  . MGF  Deceased   Family History  Problem Relation Age of Onset  . Hypertension  Mother   . Arthritis Mother   . Hypertension Father   . Thyroid cancer Father   . Bone cancer Father   . Alzheimer's disease Maternal Grandmother   . Hypertension Maternal Grandmother   . Diabetes Maternal Grandmother   . Breast cancer Maternal Grandmother 40  . Heart disease Maternal Grandfather    Allergies  Allergen Reactions  . Shellfish Allergy Anaphylaxis    All Seafood   . Banana     Patient Care Team: Maryella Shivers as PCP - General (Physician Assistant)   Medications: Outpatient Medications Prior to Visit  Medication Sig  . gabapentin (NEURONTIN) 300 MG capsule TAKE 1 CAPSULE(300 MG) BY MOUTH THREE TIMES DAILY  . [DISCONTINUED] cyclobenzaprine (FLEXERIL) 10 MG tablet Take 1 tablet (10 mg total) by mouth at  bedtime. (Patient not taking: Reported on 05/15/2019)  . [DISCONTINUED] predniSONE (STERAPRED UNI-PAK 21 TAB) 10 MG (21) TBPK tablet PO: Take 6 tablets on day 1:Take 5 tablets day 2:Take 4 tablets day 3: Take 3 tablets day 4:Take 2 tablets day five: 5 Take 1 tablet day 6 (Patient not taking: Reported on 05/15/2019)   No facility-administered medications prior to visit.    Review of Systems    Objective    BP 138/88 (BP Location: Left Arm, Patient Position: Sitting, Cuff Size: Normal)   Pulse 86   Temp (!) 95.9 F (35.5 C) (Temporal)   Ht 5\' 2"  (1.575 m)   Wt 168 lb 12.8 oz (76.6 kg)   SpO2 99%   BMI 30.87 kg/m    Physical Exam Constitutional:      Appearance: Normal appearance.  Neck:     Thyroid: Thyromegaly present. No thyroid mass or thyroid tenderness.   Cardiovascular:     Rate and Rhythm: Normal rate and regular rhythm.     Heart sounds: Normal heart sounds.  Pulmonary:     Effort: Pulmonary effort is normal.     Breath sounds: Normal breath sounds.  Skin:    General: Skin is warm and dry.  Neurological:     Mental Status: She is alert and oriented to person, place, and time. Mental status is at baseline.  Psychiatric:        Mood and Affect: Mood normal.        Behavior: Behavior normal.       Depression Screen  PHQ 2/9 Scores 10/15/2019 05/15/2019 04/10/2018  PHQ - 2 Score 0 0 0  PHQ- 9 Score 0 - -    No results found for any visits on 10/15/19.  Assessment & Plan    Routine Health Maintenance and Physical Exam  Exercise Activities and Dietary recommendations Goals   None      There is no immunization history on file for this patient.  Health Maintenance  Topic Date Due  . Hepatitis C Screening  Never done  . COVID-19 Vaccine (1) Never done  . TETANUS/TDAP  Never done  . COLONOSCOPY  Never done  . MAMMOGRAM  10/14/2019  . INFLUENZA VACCINE  12/08/2019  . HIV Screening  Completed    Discussed health benefits of physical activity, and  encouraged her to engage in regular exercise appropriate for her age and condition.  1. Annual physical exam  - TSH - Lipid panel - Comprehensive metabolic panel - CBC with Differential/Platelet - Hepatitis c antibody (reflex)  2. H/O partial thyroidectomy   3. Encounter for screening mammogram for malignant neoplasm of breast  - MM Digital Screening; Future  4.  Colon cancer screening  - Ambulatory referral to Gastroenterology  5. Perimenopausal   6. Enlarged thyroid  Need to reimage. TSH and TPO as below. Pending workup may need referral to endocrinology.   - US THYROID; Future - Thyroid peroxidase antibody  7. Lumbar Radiculopathy  Continue gabapentin   Return in about 1 year (around 10/14/2020) for CPE.     ITrey Sailors, PA-C, have reviewed all documentation for this visit. The documentation on 10/15/19 for the exam, diagnosis, procedures, and orders are all accurate and complete.    Maryella Shivers  [facility] [phone] (phone) [phone] (fax)  [facility]

## 2019-10-16 LAB — CBC WITH DIFFERENTIAL/PLATELET
Basophils Absolute: 0.1 10*3/uL (ref 0.0–0.2)
Basos: 1 %
EOS (ABSOLUTE): 0.1 10*3/uL (ref 0.0–0.4)
Eos: 3 %
Hematocrit: 41.6 % (ref 34.0–46.6)
Hemoglobin: 13.4 g/dL (ref 11.1–15.9)
Immature Grans (Abs): 0 10*3/uL (ref 0.0–0.1)
Immature Granulocytes: 0 %
Lymphocytes Absolute: 2.6 10*3/uL (ref 0.7–3.1)
Lymphs: 49 %
MCH: 25.5 pg — ABNORMAL LOW (ref 26.6–33.0)
MCHC: 32.2 g/dL (ref 31.5–35.7)
MCV: 79 fL (ref 79–97)
Monocytes Absolute: 0.4 10*3/uL (ref 0.1–0.9)
Monocytes: 7 %
Neutrophils Absolute: 2.1 10*3/uL (ref 1.4–7.0)
Neutrophils: 40 %
Platelets: 310 10*3/uL (ref 150–450)
RBC: 5.26 x10E6/uL (ref 3.77–5.28)
RDW: 13.7 % (ref 11.7–15.4)
WBC: 5.3 10*3/uL (ref 3.4–10.8)

## 2019-10-16 LAB — COMPREHENSIVE METABOLIC PANEL
ALT: 35 IU/L — ABNORMAL HIGH (ref 0–32)
AST: 30 IU/L (ref 0–40)
Albumin/Globulin Ratio: 1.7 (ref 1.2–2.2)
Albumin: 4.9 g/dL (ref 3.8–4.9)
Alkaline Phosphatase: 100 IU/L (ref 48–121)
BUN/Creatinine Ratio: 18 (ref 9–23)
BUN: 14 mg/dL (ref 6–24)
Bilirubin Total: 0.2 mg/dL (ref 0.0–1.2)
CO2: 19 mmol/L — ABNORMAL LOW (ref 20–29)
Calcium: 9.3 mg/dL (ref 8.7–10.2)
Chloride: 104 mmol/L (ref 96–106)
Creatinine, Ser: 0.8 mg/dL (ref 0.57–1.00)
GFR calc Af Amer: 96 mL/min/{1.73_m2} (ref >59)
GFR calc non Af Amer: 83 mL/min/{1.73_m2} (ref >59)
Globulin, Total: 2.9 g/dL (ref 1.5–4.5)
Glucose: 118 mg/dL — ABNORMAL HIGH (ref 65–99)
Potassium: 4.5 mmol/L (ref 3.5–5.2)
Sodium: 138 mmol/L (ref 134–144)
Total Protein: 7.8 g/dL (ref 6.0–8.5)

## 2019-10-16 LAB — LIPID PANEL
Chol/HDL Ratio: 4.9 ratio — ABNORMAL HIGH (ref 0.0–4.4)
Cholesterol, Total: 204 mg/dL — ABNORMAL HIGH (ref 100–199)
HDL: 42 mg/dL (ref >39)
LDL Chol Calc (NIH): 125 mg/dL — ABNORMAL HIGH (ref 0–99)
Triglycerides: 207 mg/dL — ABNORMAL HIGH (ref 0–149)
VLDL Cholesterol Cal: 37 mg/dL (ref 5–40)

## 2019-10-16 LAB — HEPATITIS C ANTIBODY (REFLEX): HCV Ab: <0.1 s/co ratio (ref 0.0–0.9)

## 2019-10-16 LAB — THYROID PEROXIDASE ANTIBODY: Thyroperoxidase Ab SerPl-aCnc: 9 IU/mL (ref 0–34)

## 2019-10-16 LAB — HCV COMMENT:

## 2019-10-16 LAB — TSH: TSH: 2.02 u[IU]/mL (ref 0.450–4.500)

## 2019-10-17 NOTE — Progress Notes (Signed)
Lab was added and faxed to [facility].

## 2019-10-18 ENCOUNTER — Ambulatory Visit: Payer: BC Managed Care – PPO | Attending: Internal Medicine

## 2019-10-18 DIAGNOSIS — Z23 Encounter for immunization: Secondary | ICD-10-CM

## 2019-10-18 NOTE — Progress Notes (Signed)
° °  Covid-19 Vaccination Clinic  Name:  Pamela Chapman    MRN: [id number] DOB: [birth year]  10/18/2019  [name] was observed post Covid-19 immunization for 15 minutes without incident. She was provided with Vaccine Information Sheet and instruction to access the V-Safe system.   [name] was instructed to call 911 with any severe reactions post vaccine:  Difficulty breathing   Swelling of face and throat   A fast heartbeat   A bad rash all over body   Dizziness and weakness   Immunizations Administered    Name Date Dose VIS Date Route   Pfizer COVID-19 Vaccine 10/18/2019  8:33 AM 0.3 mL 07/03/2018 Intramuscular   Manufacturer: [employer], [employer]   Lot: [id number]   NDC: [address]-3

## 2019-10-19 LAB — HEMOGLOBIN A1C
Est. average glucose Bld gHb Est-mCnc: 148 mg/dL
Hgb A1c MFr Bld: 6.8 % — ABNORMAL HIGH (ref 4.8–5.6)

## 2019-10-19 LAB — SPECIMEN STATUS REPORT

## 2019-10-21 ENCOUNTER — Ambulatory Visit: Payer: BC Managed Care – PPO

## 2019-10-22 ENCOUNTER — Telehealth: Payer: Self-pay

## 2019-10-22 NOTE — Telephone Encounter (Signed)
Patient was advised and scheduled appointment on 11/13/2019 to follow-up with Adriana on A1C and to have rechecked in office. Patient states that she will work at cutting back on sugars.

## 2019-10-22 NOTE — Telephone Encounter (Signed)
-----   Message from Margaretann Loveless, [place] sent at 10/22/2019 10:11 AM EDT ----- Labs unremarkable except that sugar levels are elevated and now indicating diabetes with an a1c of 6.8 (a1c of 6.5 or greater indicates diabetes). Would recommend to schedule an appt with Pamela Chapman to discuss. Staff, I would schedule her for 40 minutes for new diabetic patient please.

## 2019-10-23 ENCOUNTER — Other Ambulatory Visit: Payer: Self-pay

## 2019-10-23 ENCOUNTER — Encounter: Payer: Self-pay | Admitting: *Deleted

## 2019-10-23 ENCOUNTER — Ambulatory Visit
Admission: RE | Admit: 2019-10-23 | Discharge: 2019-10-23 | Disposition: A | Discharge status: Home / Self Care | Payer: BC Managed Care – PPO | Source: Ambulatory Visit | Attending: Physician Assistant | Admitting: Physician Assistant

## 2019-10-23 DIAGNOSIS — E049 Nontoxic goiter, unspecified: Secondary | ICD-10-CM | POA: Diagnosis present

## 2019-10-28 ENCOUNTER — Telehealth: Payer: Self-pay

## 2019-10-28 NOTE — Telephone Encounter (Signed)
Called patient and no answer, left voicemail message for patient to return call. If patient calls back okay for PEC to advise patient of message. 

## 2019-10-28 NOTE — Telephone Encounter (Signed)
Left VM to return call for results. 

## 2019-10-28 NOTE — Telephone Encounter (Signed)
-----   Message from Margaretann Loveless, PA-C sent at 10/23/2019 11:16 AM EDT ----- No discrete thyroid nodules are noted on the right side. US shows that the right side of the remaining thyroid gland has diffuse and lobular appearance with all nodules appearing similar indicating thyroid goiter. No distinct area requiring further observation or biopsy.

## 2019-10-29 NOTE — Telephone Encounter (Signed)
I called and gave pt the message from Joycelyn Man dated 10/23/2019 at 11:16 AM regarding her U/S of her thyroid.   She did not have any questions and was happy with the result.

## 2019-11-12 ENCOUNTER — Ambulatory Visit: Payer: BC Managed Care – PPO

## 2019-11-13 ENCOUNTER — Ambulatory Visit: Payer: Self-pay | Admitting: Physician Assistant

## 2019-11-19 ENCOUNTER — Other Ambulatory Visit: Payer: Self-pay

## 2019-11-19 ENCOUNTER — Ambulatory Visit (INDEPENDENT_AMBULATORY_CARE_PROVIDER_SITE_OTHER): Payer: BC Managed Care – PPO | Admitting: Physician Assistant

## 2019-11-19 ENCOUNTER — Encounter: Payer: Self-pay | Admitting: Physician Assistant

## 2019-11-19 VITALS — BP 124/78 | HR 83 | Temp 96.8°F | Wt 171.0 lb

## 2019-11-19 DIAGNOSIS — E785 Hyperlipidemia, unspecified: Secondary | ICD-10-CM

## 2019-11-19 DIAGNOSIS — E119 Type 2 diabetes mellitus without complications: Secondary | ICD-10-CM

## 2019-11-19 DIAGNOSIS — E049 Nontoxic goiter, unspecified: Secondary | ICD-10-CM | POA: Diagnosis not present

## 2019-11-19 DIAGNOSIS — N951 Menopausal and female climacteric states: Secondary | ICD-10-CM

## 2019-11-19 DIAGNOSIS — E890 Postprocedural hypothyroidism: Secondary | ICD-10-CM

## 2019-11-19 DIAGNOSIS — Z9009 Acquired absence of other part of head and neck: Secondary | ICD-10-CM

## 2019-11-19 LAB — POCT GLYCOSYLATED HEMOGLOBIN (HGB A1C)
Est. average glucose Bld gHb Est-mCnc: 151
Hemoglobin A1C: 6.9 % — AB (ref 4.0–5.6)

## 2019-11-19 MED ORDER — CLONIDINE HCL 0.1 MG PO TABS: 0.1000 mg | ORAL_TABLET | Freq: Every day | ORAL | 0 refills | Status: DC

## 2019-11-19 MED ORDER — ATORVASTATIN CALCIUM 10 MG PO TABS: 10.0000 mg | ORAL_TABLET | Freq: Every day | ORAL | 3 refills | Status: DC

## 2019-11-19 NOTE — Progress Notes (Signed)
Established patient visit   Patient: Pamela Chapman   DOB: 07/08/1965   54 y.o. Female  MRN: [id number] Visit Date: 11/19/2019  Today's healthcare provider: Trey Sailors, PA-C   Chief Complaint  Patient presents with  . New Onset Diabetes  . Constipation  . Hot Flashes  I,Porsha C McClurkin,acting as a scribe for Trey Sailors, PA-C.,have documented all relevant documentation on the behalf of Trey Sailors, PA-C,as directed by  Trey Sailors, PA-C while in the presence of Trey Sailors, PA-C.  Subjective    Constipation This is a new problem. The current episode started 1 to 4 weeks ago. The problem is unchanged. The patient is not on a high fiber diet. She exercises regularly. There has been adequate water intake. Associated symptoms include abdominal pain and bloating. Pertinent negatives include no diarrhea, fecal incontinence, hemorrhoids, nausea, rectal pain or vomiting. Risk factors include change in medication usage/dosage. She has tried laxatives and stool softeners for the symptoms. The treatment provided no relief.    New Onset Diabetes Patient presents today to discuss new onset diabetes.    Lab Results  Component Value Date   HGBA1C 6.9 (A) 11/19/2019   HGBA1C 6.8 (H) 10/15/2019   Wt Readings from Last 3 Encounters:  11/19/19 171 lb (77.6 kg)  10/15/19 168 lb 12.8 oz (76.6 kg)  05/15/19 172 lb 3.2 oz (78.1 kg)   Symptoms: No fatigue No foot ulcerations  No appetite changes No nausea  No paresthesia of the feet  No polydipsia  No polyuria No visual disturbances   No vomiting     Home blood sugar records: not being checked.  Episodes of hypoglycemia? No    Current insulin regiment: none Most Recent Eye Exam: Not done Current exercise: housecleaning and walking Current diet habits: well balanced  Pertinent Labs: Lab Results  Component Value Date   CHOL 204 (H) 10/15/2019   HDL 42 10/15/2019   LDLCALC 125 (H) 10/15/2019    TRIG 207 (H) 10/15/2019   CHOLHDL 4.9 (H) 10/15/2019   Lab Results  Component Value Date   NA 138 10/15/2019   K 4.5 10/15/2019   CREATININE 0.80 10/15/2019   GFRNONAA 83 10/15/2019   GFRAA 96 10/15/2019   GLUCOSE 118 (H) 10/15/2019      Hot Flashes Patient reports that she has severe hot flashes. She reports sweats and chocolate cravings a lot. Patient says that she sets her air condition on 60 with fans running as well due to how hot she gets.    Medications: Outpatient Medications Prior to Visit  Medication Sig  . gabapentin (NEURONTIN) 300 MG capsule TAKE 1 CAPSULE(300 MG) BY MOUTH THREE TIMES DAILY   No facility-administered medications prior to visit.    Review of Systems  Constitutional: Negative.   Respiratory: Negative.   Cardiovascular: Negative.   Gastrointestinal: Positive for abdominal pain, bloating and constipation. Negative for diarrhea, hemorrhoids, nausea, rectal pain and vomiting.  Hematological: Negative.       Objective    BP 124/78 (BP Location: Left Arm, Patient Position: Sitting, Cuff Size: Normal)   Pulse 83   Temp (!) 96.8 F (36 C) (Temporal)   Wt 171 lb (77.6 kg)   SpO2 96%   BMI 31.28 kg/m    Physical Exam Constitutional:      Appearance: Normal appearance.  Cardiovascular:     Rate and Rhythm: Normal rate and regular rhythm.     Pulses: Normal pulses.  Heart sounds: Normal heart sounds.  Pulmonary:     Effort: Pulmonary effort is normal.     Breath sounds: Normal breath sounds.  Musculoskeletal:     Right foot: Normal.     Left foot: Normal.  Skin:    General: Skin is warm and dry.  Neurological:     General: No focal deficit present.     Mental Status: She is alert and oriented to person, place, and time.  Psychiatric:        Mood and Affect: Mood normal.        Behavior: Behavior normal.       Results for orders placed or performed in visit on 11/19/19  POCT glycosylated hemoglobin (Hb A1C)  Result Value Ref  Range   Hemoglobin A1C 6.9 (A) 4.0 - 5.6 %   HbA1c POC (<> result, manual entry)     HbA1c, POC (prediabetic range)     HbA1c, POC (controlled diabetic range)     Est. average glucose Bld gHb Est-mCnc 151     Assessment & Plan    1. Diabetes mellitus, new onset [facility]) Patient has new onset of diabetes that is currently diet controlled. Patient had a A1C reading of 6.8 and today was 6.9. Patient will be started on a cholesterol medication today as below. Defer pneumovax as she is in the middle of getting COVID vaccine.   - POCT glycosylated hemoglobin (Hb A1C) - atorvastatin (LIPITOR) 10 MG tablet; Take 1 tablet (10 mg total) by mouth daily.  Dispense: 90 tablet; Refill: 3 - Microalbumin / creatinine urine ratio  2. Hyperlipidemia, unspecified hyperlipidemia type  - atorvastatin (LIPITOR) 10 MG tablet; Take 1 tablet (10 mg total) by mouth daily.  Dispense: 90 tablet; Refill: 3  3. H/O partial thyroidectomy  - Ambulatory referral to Endocrinology  4. Enlarged thyroid  With history of cancerous cells and right thyroidectomy, recommend follow up with endo.   - Ambulatory referral to Endocrinology  5. Hot flashes due to menopause  - cloNIDine (CATAPRES) 0.1 MG tablet; Take 1 tablet (0.1 mg total) by mouth daily.  Dispense: 90 tablet; Refill: 0   Return in about 1 month (around 12/20/2019) for DM.     ITrey Sailors, PA-C, have reviewed all documentation for this visit. The documentation on 11/22/19 for the exam, diagnosis, procedures, and orders are all accurate and complete.    Maryella Shivers  [facility] [phone] (phone) [phone] (fax)  [facility]

## 2019-11-19 NOTE — Patient Instructions (Signed)
Diabetes Mellitus and Exercise Exercising regularly is important for your overall health, especially when you have diabetes (diabetes mellitus). Exercising is not only about losing weight. It has many other health benefits, such as increasing muscle strength and bone density and reducing body fat and stress. This leads to improved fitness, flexibility, and endurance, all of which result in better overall health. Exercise has additional benefits for people with diabetes, including:  Reducing appetite.  Helping to lower and control blood glucose.  Lowering blood pressure.  Helping to control amounts of fatty substances (lipids) in the blood, such as cholesterol and triglycerides.  Helping the body to respond better to insulin (improving insulin sensitivity).  Reducing how much insulin the body needs.  Decreasing the risk for heart disease by: ? Lowering cholesterol and triglyceride levels. ? Increasing the levels of good cholesterol. ? Lowering blood glucose levels. What is my activity plan? Your health care provider or certified diabetes educator can help you make a plan for the type and frequency of exercise (activity plan) that works for you. Make sure that you:  Do at least 150 minutes of moderate-intensity or vigorous-intensity exercise each week. This could be brisk walking, biking, or water aerobics. ? Do stretching and strength exercises, such as yoga or weightlifting, at least 2 times a week. ? Spread out your activity over at least 3 days of the week.  Get some form of physical activity every day. ? Do not go more than 2 days in a row without some kind of physical activity. ? Avoid being inactive for more than 30 minutes at a time. Take frequent breaks to walk or stretch.  Choose a type of exercise or activity that you enjoy, and set realistic goals.  Start slowly, and gradually increase the intensity of your exercise over time. What do I need to know about managing my  diabetes?   Check your blood glucose before and after exercising. ? If your blood glucose is 240 mg/dL (13.3 mmol/L) or higher before you exercise, check your urine for ketones. If you have ketones in your urine, do not exercise until your blood glucose returns to normal. ? If your blood glucose is 100 mg/dL (5.6 mmol/L) or lower, eat a snack containing 15-20 grams of carbohydrate. Check your blood glucose 15 minutes after the snack to make sure that your level is above 100 mg/dL (5.6 mmol/L) before you start your exercise.  Know the symptoms of low blood glucose (hypoglycemia) and how to treat it. Your risk for hypoglycemia increases during and after exercise. Common symptoms of hypoglycemia can include: ? Hunger. ? Anxiety. ? Sweating and feeling clammy. ? Confusion. ? Dizziness or feeling light-headed. ? Increased heart rate or palpitations. ? Blurry vision. ? Tingling or numbness around the mouth, lips, or tongue. ? Tremors or shakes. ? Irritability.  Keep a rapid-acting carbohydrate snack available before, during, and after exercise to help prevent or treat hypoglycemia.  Avoid injecting insulin into areas of the body that are going to be exercised. For example, avoid injecting insulin into: ? The arms, when playing tennis. ? The legs, when jogging.  Keep records of your exercise habits. Doing this can help you and your health care provider adjust your diabetes management plan as needed. Write down: ? Food that you eat before and after you exercise. ? Blood glucose levels before and after you exercise. ? The type and amount of exercise you have done. ? When your insulin is expected to peak, if you use   insulin. Avoid exercising at times when your insulin is peaking.  When you start a new exercise or activity, work with your health care provider to make sure the activity is safe for you, and to adjust your insulin, medicines, or food intake as needed.  Drink plenty of water while  you exercise to prevent dehydration or heat stroke. Drink enough fluid to keep your urine clear or pale yellow. Summary  Exercising regularly is important for your overall health, especially when you have diabetes (diabetes mellitus).  Exercising has many health benefits, such as increasing muscle strength and bone density and reducing body fat and stress.  Your health care provider or certified diabetes educator can help you make a plan for the type and frequency of exercise (activity plan) that works for you.  When you start a new exercise or activity, work with your health care provider to make sure the activity is safe for you, and to adjust your insulin, medicines, or food intake as needed. This information is not intended to replace advice given to you by your health care provider. Make sure you discuss any questions you have with your health care provider. Document Revised: 11/17/2016 Document Reviewed: 10/05/2015 Elsevier Patient Education  2020 Elsevier Inc.  

## 2019-11-20 LAB — MICROALBUMIN / CREATININE URINE RATIO
Creatinine, Urine: 38.9 mg/dL
Microalb/Creat Ratio: 9 mg/g creat (ref 0–29)
Microalbumin, Urine: 3.6 ug/mL

## 2019-11-22 DIAGNOSIS — E119 Type 2 diabetes mellitus without complications: Secondary | ICD-10-CM | POA: Insufficient documentation

## 2019-11-28 ENCOUNTER — Other Ambulatory Visit: Payer: Self-pay | Admitting: Physician Assistant

## 2019-11-28 DIAGNOSIS — Z1231 Encounter for screening mammogram for malignant neoplasm of breast: Secondary | ICD-10-CM

## 2019-11-29 ENCOUNTER — Ambulatory Visit: Payer: BC Managed Care – PPO | Attending: Internal Medicine

## 2019-11-29 DIAGNOSIS — Z23 Encounter for immunization: Secondary | ICD-10-CM

## 2019-11-29 NOTE — Progress Notes (Signed)
   Covid-19 Vaccination Clinic  Name:  Pamela Chapman    MRN: [id number] DOB: 1965/08/10  11/29/2019  Pamela Chapman was observed post Covid-19 immunization for 15 minutes without incident. She was provided with Vaccine Information Sheet and instruction to access the V-Safe system.   Pamela Chapman was instructed to call 911 with any severe reactions post vaccine: Marland Kitchen Difficulty breathing  . Swelling of face and throat  . A fast heartbeat  . A bad rash all over body  . Dizziness and weakness   Immunizations Administered    Name Date Dose VIS Date Route   Pfizer COVID-19 Vaccine 11/29/2019  9:11 AM 0.3 mL 07/03/2018 Intramuscular   Manufacturer: [employer], [employer]   Lot: [id number]   NDC: [address]-2

## 2019-12-19 NOTE — Progress Notes (Signed)
Established patient visit   Patient: Pamela Chapman   DOB: 20-Sep-1965   54 y.o. Female  MRN: [id number] Visit Date: 12/20/2019  Today's healthcare provider: Trey Sailors, PA-C   Chief Complaint  Patient presents with  . Diabetes  I,Aqua Denslow M Ty Buntrock,acting as a scribe for [employer], PA-C.,have documented all relevant documentation on the behalf of Trey Sailors, PA-C,as directed by  Trey Sailors, PA-C while in the presence of Trey Sailors, PA-C.  Subjective    HPI  Diabetes Mellitus Type II, Follow-up  Lab Results  Component Value Date   HGBA1C 6.9 (A) 11/19/2019   HGBA1C 6.8 (H) 10/15/2019   Wt Readings from Last 3 Encounters:  12/20/19 168 lb 9.6 oz (76.5 kg)  11/19/19 171 lb (77.6 kg)  10/15/19 168 lb 12.8 oz (76.6 kg)   Last seen for diabetes 1 months ago.  Management since then includes no changes. She reports good compliance with treatment. She is not having side effects.  Symptoms: No fatigue No foot ulcerations  No appetite changes No nausea  No paresthesia of the feet  No polydipsia  No polyuria No visual disturbances   No vomiting     Home blood sugar records: not being checked  Episodes of hypoglycemia? No    Current insulin regiment:none Most Recent Eye Exam: [facility]. She says she got an eye exam this year already and insurance won't pay for another.   Current exercise: walking Current diet habits: well balanced  Pertinent Labs: Lab Results  Component Value Date   CHOL 204 (H) 10/15/2019   HDL 42 10/15/2019   LDLCALC 125 (H) 10/15/2019   TRIG 207 (H) 10/15/2019   CHOLHDL 4.9 (H) 10/15/2019   Lab Results  Component Value Date   NA 138 10/15/2019   K 4.5 10/15/2019   CREATININE 0.80 10/15/2019   GFRNONAA 83 10/15/2019   GFRAA 96 10/15/2019   GLUCOSE 118 (H) 10/15/2019     Lipid/Cholesterol, Follow-up  Last lipid panel Other pertinent labs  Lab Results  Component Value Date    CHOL 204 (H) 10/15/2019   HDL 42 10/15/2019   LDLCALC 125 (H) 10/15/2019   TRIG 207 (H) 10/15/2019   CHOLHDL 4.9 (H) 10/15/2019   Lab Results  Component Value Date   ALT 35 (H) 10/15/2019   AST 30 10/15/2019   PLT 310 10/15/2019   TSH 2.020 10/15/2019     She was last seen for this 1 months ago.  Management since that visit includes starting lipitor 10 mg daily.  She reports good compliance with treatment. She is not having side effects.   Symptoms: No chest pain No chest pressure/discomfort  No dyspnea No lower extremity edema  No numbness or tingling of extremity No orthopnea  No palpitations No paroxysmal nocturnal dyspnea  No speech difficulty No syncope   Current diet: not asked Current exercise: aerobics  The 10-year ASCVD risk score Denman George DC Jr., et al., 2013) is: 13.9%  BP Readings from Last 3 Encounters:  12/20/19 (!) 142/94  11/19/19 124/78  10/15/19 138/88   Patient also reports earache in her left ear ongoing for two weeks. No fevers chills.   Reports intermittent left ear pain. Denies hearing loss, discharge, swimming.  ---------------------------------------------------------------------------------------------------     Medications: Outpatient Medications Prior to Visit  Medication Sig  . atorvastatin (LIPITOR) 10 MG tablet Take 1 tablet (10 mg total) by mouth daily.  . cloNIDine (CATAPRES)  0.1 MG tablet Take 1 tablet (0.1 mg total) by mouth daily.  Marland Kitchen gabapentin (NEURONTIN) 300 MG capsule TAKE 1 CAPSULE(300 MG) BY MOUTH THREE TIMES DAILY (Patient not taking: Reported on 12/20/2019)   No facility-administered medications prior to visit.    Review of Systems  HENT: Positive for ear pain.   All other systems reviewed and are negative.     Objective    BP (!) 142/94 (BP Location: Left Arm, Patient Position: Sitting, Cuff Size: Normal)   Pulse 87   Temp 98.6 F (37 C) (Oral)   Wt 168 lb 9.6 oz (76.5 kg)   SpO2 99%   BMI 30.84 kg/m     Physical Exam Constitutional:      Appearance: Normal appearance.  Cardiovascular:     Rate and Rhythm: Normal rate and regular rhythm.     Heart sounds: Normal heart sounds.  Pulmonary:     Effort: Pulmonary effort is normal.     Breath sounds: Normal breath sounds.  Skin:    General: Skin is warm and dry.  Neurological:     Mental Status: She is alert and oriented to person, place, and time. Mental status is at baseline.  Psychiatric:        Mood and Affect: Mood normal.        Behavior: Behavior normal.       No results found for any visits on 12/20/19.  Assessment & Plan    1. Diabetes mellitus, new onset (HCC)  Well controlled with last A1c 6.9 Diet controlled for now.  Needs pneumonia vaccine next visit. Apparently her insurance won't pay for another eye exam so she will need it last year.   On Statin Discussed diet and exercise F/u in 3 months   2. Hyperlipidemia associated with type 2 diabetes mellitus (HCC)  Continue statin. Check lipids next visit.   3. Colon cancer screening  - Ambulatory referral to Gastroenterology  4. Left ear pain     Return in about 3 months (around 03/21/2020) for DM.      ITrey Sailors, PA-C, have reviewed all documentation for this visit. The documentation on 12/25/19 for the exam, diagnosis, procedures, and orders are all accurate and complete.    Maryella Shivers  [facility] [phone] (phone) [phone] (fax)  [facility]

## 2019-12-20 ENCOUNTER — Encounter: Payer: Self-pay | Admitting: Physician Assistant

## 2019-12-20 ENCOUNTER — Other Ambulatory Visit: Payer: Self-pay

## 2019-12-20 ENCOUNTER — Ambulatory Visit (INDEPENDENT_AMBULATORY_CARE_PROVIDER_SITE_OTHER): Payer: BC Managed Care – PPO | Admitting: Physician Assistant

## 2019-12-20 VITALS — BP 142/94 | HR 87 | Temp 98.6°F | Wt 168.6 lb

## 2019-12-20 DIAGNOSIS — E119 Type 2 diabetes mellitus without complications: Secondary | ICD-10-CM | POA: Diagnosis not present

## 2019-12-20 DIAGNOSIS — E785 Hyperlipidemia, unspecified: Secondary | ICD-10-CM

## 2019-12-20 DIAGNOSIS — Z1211 Encounter for screening for malignant neoplasm of colon: Secondary | ICD-10-CM

## 2019-12-20 DIAGNOSIS — E1169 Type 2 diabetes mellitus with other specified complication: Secondary | ICD-10-CM | POA: Insufficient documentation

## 2019-12-20 DIAGNOSIS — H9202 Otalgia, left ear: Secondary | ICD-10-CM | POA: Diagnosis not present

## 2020-01-07 ENCOUNTER — Telehealth (INDEPENDENT_AMBULATORY_CARE_PROVIDER_SITE_OTHER): Payer: Self-pay | Admitting: Gastroenterology

## 2020-01-07 ENCOUNTER — Other Ambulatory Visit: Payer: Self-pay

## 2020-01-07 DIAGNOSIS — Z1211 Encounter for screening for malignant neoplasm of colon: Secondary | ICD-10-CM

## 2020-01-07 NOTE — Progress Notes (Signed)
Gastroenterology Pre-Procedure Review  Request Date: Monday 03/02/20 Requesting Physician: Dr. Allegra Lai  PATIENT REVIEW QUESTIONS: The patient responded to the following health history questions as indicated:    1. Are you having any GI issues? no 2. Do you have a personal history of Polyps? yes (greater than 10 years colonoscopy was performed in [facility]) 3. Do you have a family history of Colon Cancer or Polyps? no 4. Diabetes Mellitus? yes (newly diagnosed no meds started at this time) 5. Joint replacements in the past 12 months?no 6. Major health problems in the past 3 months?no 7. Any artificial heart valves, MVP, or defibrillator?no    MEDICATIONS & ALLERGIES:    Patient reports the following regarding taking any anticoagulation/antiplatelet therapy:   Plavix, Coumadin, Eliquis, Xarelto, Lovenox, Pradaxa, Brilinta, or Effient? no Aspirin? no  Patient confirms/reports the following medications:  Current Outpatient Medications  Medication Sig Dispense Refill  . atorvastatin (LIPITOR) 10 MG tablet Take 1 tablet (10 mg total) by mouth daily. 90 tablet 3  . cloNIDine (CATAPRES) 0.1 MG tablet Take 1 tablet (0.1 mg total) by mouth daily. 90 tablet 0  . Multiple Vitamin (MULTIVITAMIN ADULT PO) Take by mouth.    Marland Kitchen acetaminophen (TYLENOL) 325 MG tablet Take by mouth. (Patient not taking: Reported on 01/07/2020)    . gabapentin (NEURONTIN) 300 MG capsule TAKE 1 CAPSULE(300 MG) BY MOUTH THREE TIMES DAILY (Patient not taking: Reported on 12/20/2019) 270 capsule 2  . ibuprofen (ADVIL) 200 MG tablet Take by mouth. (Patient not taking: Reported on 01/07/2020)    . naproxen (NAPROSYN) 250 MG tablet Take by mouth. (Patient not taking: Reported on 01/07/2020)     No current facility-administered medications for this visit.    Patient confirms/reports the following allergies:  Allergies  Allergen Reactions  . Iodinated Diagnostic Agents Anaphylaxis    All Seafood  . Shellfish Allergy Anaphylaxis     All Seafood   . Banana     Orders Placed This Encounter  Procedures  . Procedural/ Surgical Case Request: COLONOSCOPY WITH PROPOFOL    Standing Status:   Standing    Number of Occurrences:   1    Order Specific Question:   Pre-op diagnosis    Answer:   screening colonoscopy    Order Specific Question:   CPT Code    Answer:   15056    AUTHORIZATION INFORMATION Primary Insurance: 1D#: Group #:  Secondary Insurance: 1D#: Group #:  SCHEDULE INFORMATION: Date: 03/02/20 Time: Location:[facility]

## 2020-01-23 ENCOUNTER — Ambulatory Visit
Admission: RE | Admit: 2020-01-23 | Discharge: 2020-01-23 | Disposition: A | Discharge status: Home / Self Care | Payer: BC Managed Care – PPO | Source: Ambulatory Visit | Attending: Physician Assistant | Admitting: Physician Assistant

## 2020-01-23 ENCOUNTER — Other Ambulatory Visit: Payer: Self-pay

## 2020-01-23 DIAGNOSIS — Z1231 Encounter for screening mammogram for malignant neoplasm of breast: Secondary | ICD-10-CM | POA: Diagnosis not present

## 2020-02-15 ENCOUNTER — Other Ambulatory Visit: Payer: Self-pay | Admitting: Physician Assistant

## 2020-02-15 DIAGNOSIS — N951 Menopausal and female climacteric states: Secondary | ICD-10-CM

## 2020-02-19 ENCOUNTER — Other Ambulatory Visit: Payer: Self-pay | Admitting: Physician Assistant

## 2020-02-19 DIAGNOSIS — N951 Menopausal and female climacteric states: Secondary | ICD-10-CM

## 2020-02-27 ENCOUNTER — Other Ambulatory Visit: Payer: Self-pay

## 2020-02-27 ENCOUNTER — Other Ambulatory Visit
Admission: RE | Admit: 2020-02-27 | Discharge: 2020-02-27 | Disposition: A | Discharge status: Home / Self Care | Payer: BC Managed Care – PPO | Source: Ambulatory Visit | Attending: Gastroenterology | Admitting: Gastroenterology

## 2020-02-27 DIAGNOSIS — Z20822 Contact with and (suspected) exposure to covid-19: Secondary | ICD-10-CM | POA: Diagnosis not present

## 2020-02-27 DIAGNOSIS — Z01812 Encounter for preprocedural laboratory examination: Secondary | ICD-10-CM | POA: Diagnosis not present

## 2020-02-28 ENCOUNTER — Encounter: Payer: Self-pay | Admitting: Gastroenterology

## 2020-02-28 LAB — SARS CORONAVIRUS 2 (TAT 6-24 HRS): SARS Coronavirus 2: NEGATIVE

## 2020-03-02 ENCOUNTER — Ambulatory Visit: Payer: BC Managed Care – PPO | Admitting: Registered Nurse

## 2020-03-02 ENCOUNTER — Encounter
Admission: RE | Disposition: A | Discharge status: Home / Self Care | Payer: Self-pay | Source: Home / Self Care | Attending: Gastroenterology

## 2020-03-02 ENCOUNTER — Ambulatory Visit
Admission: RE | Admit: 2020-03-02 | Discharge: 2020-03-02 | Disposition: A | Discharge status: Home / Self Care | Payer: BC Managed Care – PPO | Attending: Gastroenterology | Admitting: Gastroenterology

## 2020-03-02 ENCOUNTER — Encounter: Payer: Self-pay | Admitting: Gastroenterology

## 2020-03-02 DIAGNOSIS — Q439 Congenital malformation of intestine, unspecified: Secondary | ICD-10-CM | POA: Diagnosis not present

## 2020-03-02 DIAGNOSIS — Z1211 Encounter for screening for malignant neoplasm of colon: Secondary | ICD-10-CM | POA: Diagnosis not present

## 2020-03-02 HISTORY — PX: COLONOSCOPY WITH PROPOFOL: SHX5780

## 2020-03-02 SURGERY — COLONOSCOPY WITH PROPOFOL
Anesthesia: General

## 2020-03-02 MED ORDER — PROPOFOL 500 MG/50ML IV EMUL
INTRAVENOUS | Status: DC | PRN
  Administered 2020-03-02: 140 ug/kg/min via INTRAVENOUS

## 2020-03-02 MED ORDER — SODIUM CHLORIDE 0.9 % IV SOLN
INTRAVENOUS | Status: DC
  Administered 2020-03-02: 1000 mL via INTRAVENOUS

## 2020-03-02 MED ORDER — PROPOFOL 500 MG/50ML IV EMUL
INTRAVENOUS | Status: AC
  Filled 2020-03-02: qty 150

## 2020-03-02 MED ORDER — PROPOFOL 10 MG/ML IV BOLUS
INTRAVENOUS | Status: DC | PRN
  Administered 2020-03-02: 20 mg via INTRAVENOUS
  Administered 2020-03-02: 80 mg via INTRAVENOUS
  Administered 2020-03-02: 30 mg via INTRAVENOUS

## 2020-03-02 MED ORDER — PROPOFOL 500 MG/50ML IV EMUL
INTRAVENOUS | Status: AC
  Filled 2020-03-02: qty 50

## 2020-03-02 MED ORDER — LIDOCAINE HCL (CARDIAC) PF 100 MG/5ML IV SOSY
PREFILLED_SYRINGE | INTRAVENOUS | Status: DC | PRN
  Administered 2020-03-02: 60 mg via INTRAVENOUS

## 2020-03-02 MED ORDER — PROPOFOL 500 MG/50ML IV EMUL
INTRAVENOUS | Status: AC
  Filled 2020-03-02: qty 100

## 2020-03-02 NOTE — Transfer of Care (Signed)
Immediate Anesthesia Transfer of Care Note  Patient: Pamela Chapman  Procedure(s) Performed: COLONOSCOPY WITH PROPOFOL (N/A )  Patient Location: PACU  Anesthesia Type:General  Level of Consciousness: sedated  Airway & Oxygen Therapy: Patient Spontanous Breathing  Post-op Assessment: Report given to RN and Post -op Vital signs reviewed and stable  Post vital signs: Reviewed and stable  Last Vitals:  Vitals Value Taken Time  BP 121/83 03/02/20 0947  Temp 35.9 C 03/02/20 0937  Pulse 98 03/02/20 0955  Resp 18 03/02/20 0955  SpO2 93 % 03/02/20 0955  Vitals shown include unvalidated device data.  Last Pain:  Vitals:   03/02/20 0947  TempSrc:   PainSc: 0-No pain         Complications: No complications documented.

## 2020-03-02 NOTE — H&P (Signed)
Arlyss Repress, MD [address]  [address]  [place], [place] [address]  Main: [phone]  Fax: [phone] Pager: [phone]  Primary Care Physician:  Trey Sailors, PA-C Primary Gastroenterologist:  Dr. Arlyss Repress  Pre-Procedure History & Physical: HPI:  Pamela Chapman is a 54 y.o. female is here for an colonoscopy.   History reviewed. No pertinent past medical history.  Past Surgical History:  Procedure Laterality Date  . ABDOMINAL HYSTERECTOMY    . BREAST BIOPSY Left 2011   neg bx  . CHOLECYSTECTOMY    . THYROIDECTOMY, PARTIAL Left     Prior to Admission medications   Medication Sig Start Date End Date Taking? Authorizing Provider  atorvastatin (LIPITOR) 10 MG tablet Take 1 tablet (10 mg total) by mouth daily. 11/19/19  Yes Trey Sailors, PA-C  cloNIDine (CATAPRES) 0.1 MG tablet TAKE 1 TABLET(0.1 MG) BY MOUTH DAILY 02/15/20  Yes Osvaldo Angst M, PA-C  Multiple Vitamin (MULTIVITAMIN ADULT PO) Take by mouth.   Yes [provider]  acetaminophen (TYLENOL) 325 MG tablet Take by mouth. Patient not taking: Reported on 01/07/2020    [provider]  gabapentin (NEURONTIN) 300 MG capsule TAKE 1 CAPSULE(300 MG) BY MOUTH THREE TIMES DAILY Patient not taking: Reported on 12/20/2019 08/24/19   Trey Sailors, PA-C  ibuprofen (ADVIL) 200 MG tablet Take by mouth. Patient not taking: Reported on 01/07/2020    [provider]  naproxen (NAPROSYN) 250 MG tablet Take by mouth. Patient not taking: Reported on 01/07/2020    [provider]    Allergies as of 01/07/2020 - Review Complete 01/07/2020  Allergen Reaction Noted  . Iodinated diagnostic agents Anaphylaxis 08/15/2017  . Shellfish allergy Anaphylaxis 08/15/2017  . Banana  08/15/2017    Family History  Problem Relation Age of Onset  . Hypertension Mother   . Arthritis Mother   . Hypertension Father   . Thyroid cancer Father   . Bone cancer Father   .  Alzheimer's disease Maternal Grandmother   . Hypertension Maternal Grandmother   . Diabetes Maternal Grandmother   . Breast cancer Maternal Grandmother 23  . Heart disease Maternal Grandfather     Social History   Socioeconomic History  . Marital status: Divorced    Spouse name: Not on file  . Number of children: Not on file  . Years of education: Not on file  . Highest education level: Not on file  Occupational History  . Not on file  Tobacco Use  . Smoking status: Never Smoker  . Smokeless tobacco: Never Used  Vaping Use  . Vaping Use: Never used  Substance and Sexual Activity  . Alcohol use: Never  . Drug use: Never  . Sexual activity: Not on file  Other Topics Concern  . Not on file  Social History Narrative  . Not on file   Social Determinants of Health   Financial Resource Strain:   . Difficulty of Paying Living Expenses: Not on file  Food Insecurity:   . Worried About Programme researcher, broadcasting/film/video in the Last Year: Not on file  . Ran Out of Food in the Last Year: Not on file  Transportation Needs:   . Lack of Transportation (Medical): Not on file  . Lack of Transportation (Non-Medical): Not on file  Physical Activity:   . Days of Exercise per Week: Not on file  . Minutes of Exercise per Session: Not on file  Stress:   . Feeling of Stress :  Not on file  Social Connections:   . Frequency of Communication with Friends and Family: Not on file  . Frequency of Social Gatherings with Friends and Family: Not on file  . Attends Religious Services: Not on file  . Active Member of Clubs or Organizations: Not on file  . Attends Banker Meetings: Not on file  . Marital Status: Not on file  Intimate Partner Violence:   . Fear of Current or Ex-Partner: Not on file  . Emotionally Abused: Not on file  . Physically Abused: Not on file  . Sexually Abused: Not on file    Review of Systems: See HPI, otherwise negative ROS  Physical Exam: BP (!) 137/106   Pulse 96    Temp (!) 97 F (36.1 C) (Temporal)   Resp 16   Ht 5\' 2"  (1.575 m)   Wt 72.7 kg   SpO2 100%   BMI 29.32 kg/m  General:   Alert,  pleasant and cooperative in NAD Head:  Normocephalic and atraumatic. Neck:  Supple; no masses or thyromegaly. Lungs:  Clear throughout to auscultation.    Heart:  Regular rate and rhythm. Abdomen:  Soft, nontender and nondistended. Normal bowel sounds, without guarding, and without rebound.   Neurologic:  Alert and  oriented x4;  grossly normal neurologically.  Impression/Plan: Pamela Chapman is here for an colonoscopy to be performed for colon cancer screening  Risks, benefits, limitations, and alternatives regarding  colonoscopy have been reviewed with the patient.  Questions have been answered.  All parties agreeable.   Jaquita Folds, MD  03/02/2020, 8:39 AM

## 2020-03-02 NOTE — Op Note (Signed)
[facility] Gastroenterology Patient Name: Pamela Chapman Procedure Date: 03/02/2020 9:02 AM MRN: [id number] Account #: [id number] Date of Birth: 1965/08/21 Admit Type: Outpatient Age: 54 Room: [facility] ENDO ROOM 1 Gender: Female Note Status: Finalized Procedure:             Colonoscopy Indications:           Screening for colorectal malignant neoplasm Providers:             Toney Reil MD, MD Referring MD:          Lavella Hammock. Jodi Marble (Referring MD) Medicines:             General Anesthesia Complications:         No immediate complications. Estimated blood loss: None. Procedure:             Pre-Anesthesia Assessment:                        - Prior to the procedure, a History and Physical was                         performed, and patient medications and allergies were                         reviewed. The patient is competent. The risks and                         benefits of the procedure and the sedation options and                         risks were discussed with the patient. All questions                         were answered and informed consent was obtained.                         Patient identification and proposed procedure were                         verified by the physician, the nurse, the                         anesthesiologist, the anesthetist and the technician                         in the pre-procedure area in the procedure room in the                         endoscopy suite. Mental Status Examination: alert and                         oriented. Airway Examination: normal oropharyngeal                         airway and neck mobility. Respiratory Examination:                         clear to auscultation. CV Examination: normal.  Prophylactic Antibiotics: The patient does not require                         prophylactic antibiotics. Prior Anticoagulants: The                         patient has taken no previous  anticoagulant or                         antiplatelet agents. ASA Grade Assessment: II - A                         patient with mild systemic disease. After reviewing                         the risks and benefits, the patient was deemed in                         satisfactory condition to undergo the procedure. The                         anesthesia plan was to use general anesthesia.                         Immediately prior to administration of medications,                         the patient was re-assessed for adequacy to receive                         sedatives. The heart rate, respiratory rate, oxygen                         saturations, blood pressure, adequacy of pulmonary                         ventilation, and response to care were monitored                         throughout the procedure. The physical status of the                         patient was re-assessed after the procedure.                        After obtaining informed consent, the colonoscope was                         passed under direct vision. Throughout the procedure,                         the patient's blood pressure, pulse, and oxygen                         saturations were monitored continuously. The                         Colonoscope was introduced through the anus and  advanced to the the cecum, identified by appendiceal                         orifice and ileocecal valve. The colonoscopy was                         extremely difficult due to restricted mobility of the                         colon. Successful completion of the procedure was                         aided by withdrawing the scope and replacing with the                         pediatric colonoscope. The patient tolerated the                         procedure fairly well. The quality of the bowel                         preparation was evaluated using the BBPS [facility] Bowel                         Preparation  Scale) with scores of: Right Colon = 3,                         Transverse Colon = 3 and Left Colon = 3 (entire mucosa                         seen well with no residual staining, small fragments                         of stool or opaque liquid). The total BBPS score                         equals 9. Findings:      The perianal and digital rectal examinations were normal. Pertinent       negatives include normal sphincter tone and no palpable rectal lesions.      The colon (entire examined portion) was tortuous. Advancing the scope       required changing the patient to a supine position and using manual       pressure.      The colon (entire examined portion) appeared normal.      The retroflexed view of the distal rectum and anal verge was normal and       showed no anal or rectal abnormalities. Impression:            - Tortuous colon.                        - The entire examined colon is normal.                        - The distal rectum and anal verge are normal on  retroflexion view.                        - No specimens collected. Recommendation:        - Discharge patient to home (with escort).                        - Resume previous diet today.                        - Continue present medications.                        - Repeat colonoscopy in 10 years for screening                         purposes. Procedure Code(s):     --- Professional ---                        O2774, Colorectal cancer screening; colonoscopy on                         individual not meeting criteria for high risk Diagnosis Code(s):     --- Professional ---                        Z12.11, Encounter for screening for malignant neoplasm                         of colon                        Q43.8, Other specified congenital malformations of                         intestine CPT copyright 2019 American Medical Association. All rights reserved. The codes documented in this report are  preliminary and upon coder review may  be revised to meet current compliance requirements. Dr. Libby Maw Toney Reil MD, MD 03/02/2020 9:35:45 AM This report has been signed electronically. Number of Addenda: 0 Note Initiated On: 03/02/2020 9:02 AM Scope Withdrawal Time: 0 hours 6 minutes 6 seconds  Total Procedure Duration: 0 hours 24 minutes 51 seconds  Estimated Blood Loss:  Estimated blood loss: none.      [facility]

## 2020-03-02 NOTE — Anesthesia Postprocedure Evaluation (Signed)
Anesthesia Post Note  Patient: Pamela Chapman  Procedure(s) Performed: COLONOSCOPY WITH PROPOFOL (N/A )  Patient location during evaluation: Endoscopy Anesthesia Type: General Level of consciousness: awake and alert Pain management: pain level controlled Vital Signs Assessment: post-procedure vital signs reviewed and stable Respiratory status: spontaneous breathing, nonlabored ventilation, respiratory function stable and patient connected to nasal cannula oxygen Cardiovascular status: blood pressure returned to baseline and stable Postop Assessment: no apparent nausea or vomiting Anesthetic complications: no   No complications documented.   Last Vitals:  Vitals:   03/02/20 0829 03/02/20 0937  BP: (!) 137/106 118/83  Pulse: 96   Resp: 16   Temp: (!) 36.1 C (!) 35.9 C  SpO2: 100%     Last Pain:  Vitals:   03/02/20 1007  TempSrc:   PainSc: 0-No pain                 Lenard Simmer

## 2020-03-02 NOTE — Anesthesia Preprocedure Evaluation (Signed)
Anesthesia Evaluation  Patient identified by MRN, date of birth, ID band Patient awake    Reviewed: Allergy & Precautions, H&P , NPO status , Patient's Chart, lab work & pertinent test results, reviewed documented beta blocker date and time   History of Anesthesia Complications Negative for: history of anesthetic complications  Airway Mallampati: II  TM Distance: >3 FB Neck ROM: full    Dental  (+) Dental Advidsory Given, Teeth Intact   Pulmonary neg pulmonary ROS,    Pulmonary exam normal breath sounds clear to auscultation       Cardiovascular Exercise Tolerance: Good negative cardio ROS Normal cardiovascular exam Rhythm:regular Rate:Normal     Neuro/Psych negative neurological ROS  negative psych ROS   GI/Hepatic negative GI ROS, Neg liver ROS,   Endo/Other  negative endocrine ROS  Renal/GU negative Renal ROS  negative genitourinary   Musculoskeletal   Abdominal   Peds  Hematology negative hematology ROS (+)   Anesthesia Other Findings History reviewed. No pertinent past medical history.   Reproductive/Obstetrics negative OB ROS                             Anesthesia Physical Anesthesia Plan  ASA: II  Anesthesia Plan: General   Post-op Pain Management:    Induction: Intravenous  PONV Risk Score and Plan: 3 and Propofol infusion and TIVA  Airway Management Planned: Natural Airway and Nasal Cannula  Additional Equipment:   Intra-op Plan:   Post-operative Plan:   Informed Consent: I have reviewed the patients History and Physical, chart, labs and discussed the procedure including the risks, benefits and alternatives for the proposed anesthesia with the patient or authorized representative who has indicated his/her understanding and acceptance.     Dental Advisory Given  Plan Discussed with: Anesthesiologist, CRNA and Surgeon  Anesthesia Plan Comments:          Anesthesia Quick Evaluation

## 2020-03-04 ENCOUNTER — Encounter: Payer: Self-pay | Admitting: Gastroenterology

## 2020-03-24 ENCOUNTER — Ambulatory Visit: Payer: Self-pay | Admitting: Physician Assistant

## 2020-04-08 ENCOUNTER — Ambulatory Visit (INDEPENDENT_AMBULATORY_CARE_PROVIDER_SITE_OTHER): Payer: BC Managed Care – PPO | Admitting: Physician Assistant

## 2020-04-08 ENCOUNTER — Other Ambulatory Visit: Payer: Self-pay

## 2020-04-08 VITALS — BP 143/91 | HR 100 | Temp 98.3°F | Wt 168.5 lb

## 2020-04-08 DIAGNOSIS — E1169 Type 2 diabetes mellitus with other specified complication: Secondary | ICD-10-CM

## 2020-04-08 DIAGNOSIS — Z23 Encounter for immunization: Secondary | ICD-10-CM | POA: Diagnosis not present

## 2020-04-08 DIAGNOSIS — E119 Type 2 diabetes mellitus without complications: Secondary | ICD-10-CM

## 2020-04-08 DIAGNOSIS — E785 Hyperlipidemia, unspecified: Secondary | ICD-10-CM

## 2020-04-08 NOTE — Progress Notes (Signed)
Established patient visit   Patient: Pamela Chapman   DOB: 1965/11/09   54 y.o. Female  MRN: [id number] Visit Date: 04/08/2020  Today's healthcare provider: Trey Sailors, Pamela Chapman   Chief Complaint  Patient presents with  . Diabetes  Pamela Chapman,Pamela Chapman,Pamela Pamela a scribe for [employer], Pamela Chapman.,Pamela documented all relevant documentation on the behalf of Pamela Sailors, Pamela Chapman,Pamela directed by  Pamela Sailors, Pamela Chapman while in the presence of Pamela Sailors, Pamela Chapman.  Subjective    HPI  Diabetes Mellitus Type II, Follow-up  Lab Results  Component Value Date   HGBA1C 6.8 (H) 04/08/2020   HGBA1C 6.9 (A) 11/19/2019   HGBA1C 6.8 (H) 10/15/2019   Wt Readings from Last 3 Encounters:  04/08/20 168 lb 8 oz (76.4 kg)  03/02/20 160 lb 5.1 oz (72.7 kg)  12/20/19 168 lb 9.6 oz (76.5 kg)   Last seen for diabetes 3 months ago.  Management since then includes continue current medication. She reports good compliance with treatment. She is not having side effects.  Symptoms: No fatigue No foot ulcerations  No appetite changes No nausea  No paresthesia of the feet  No polydipsia  No polyuria No visual disturbances   No vomiting     Home blood sugar records: not being checked  Episodes of hypoglycemia? No    Current insulin regiment: none Most Recent Eye Exam: plans on getting it 06/2020 Current exercise: walking Current diet habits: well balanced  Pertinent Labs: Lab Results  Component Value Date   CHOL 144 04/08/2020   HDL 44 04/08/2020   LDLCALC 72 04/08/2020   TRIG 164 (H) 04/08/2020   CHOLHDL 3.3 04/08/2020   Lab Results  Component Value Date   NA 138 10/15/2019   K 4.5 10/15/2019   CREATININE 0.80 10/15/2019   GFRNONAA 83 10/15/2019   GFRAA 96 10/15/2019   GLUCOSE 118 (H) 10/15/2019     ---------------------------------------------------------------------------------------------------  Lipid/Cholesterol, Follow-up  Last lipid panel Other pertinent  labs  Lab Results  Component Value Date   CHOL 144 04/08/2020   HDL 44 04/08/2020   LDLCALC 72 04/08/2020   TRIG 164 (H) 04/08/2020   CHOLHDL 3.3 04/08/2020   Lab Results  Component Value Date   ALT 35 (H) 10/15/2019   AST 30 10/15/2019   PLT 310 10/15/2019   TSH 2.020 10/15/2019     She was last seen for this 3 months ago.  Management since that visit includes stating lipitor.  She reports excellent compliance with treatment. She is not having side effects.   Symptoms: No chest pain No chest pressure/discomfort  No dyspnea No lower extremity edema  No numbness or tingling of extremity No orthopnea  No palpitations No paroxysmal nocturnal dyspnea  No speech difficulty No syncope   Current diet: well balanced Current exercise: none  The 10-year ASCVD risk score Denman George DC Jr., et al., 2013) is: 10%  ---------------------------------------------------------------------------------------------------  BP Readings from Last 4 Encounters:  04/08/20 (!) 143/91  03/02/20 118/83  12/20/19 (!) 142/94  11/19/19 124/78       Medications: Outpatient Medications Prior to Visit  Medication Sig  . atorvastatin (LIPITOR) 10 MG tablet Take 1 tablet (10 mg total) by mouth daily.  . cloNIDine (CATAPRES) 0.1 MG tablet TAKE 1 TABLET(0.1 MG) BY MOUTH DAILY  . Multiple Vitamin (MULTIVITAMIN ADULT PO) Take by mouth.   No facility-administered medications prior to visit.    Review of Systems  Constitutional: Negative.  Cardiovascular: Negative.   Gastrointestinal: Negative.   Hematological: Negative.       Objective    BP (!) 143/91 (BP Location: Left Arm, Patient Position: Sitting, Cuff Size: Large)   Pulse 100   Temp 98.3 F (36.8 C) (Oral)   Wt 168 lb 8 oz (76.4 kg)   SpO2 100%   BMI 30.82 kg/Pamela    Physical Exam Constitutional:      Appearance: Normal appearance.  Cardiovascular:     Rate and Rhythm: Normal rate and regular rhythm.     Pulses: Normal pulses.      Heart sounds: Normal heart sounds.  Pulmonary:     Effort: Pulmonary effort is normal.     Breath sounds: Normal breath sounds.  Skin:    General: Skin is warm and dry.  Neurological:     General: No focal deficit present.     Mental Status: She is alert and oriented to person, place, and time.  Psychiatric:        Mood and Affect: Mood normal.        Behavior: Behavior normal.       Results for orders placed or performed in visit on 04/08/20  HgB A1c  Result Value Ref Range   Hgb A1c MFr Bld 6.8 (H) 4.8 - 5.6 %   Est. average glucose Bld gHb Est-mCnc 148 mg/dL  Lipid Profile  Result Value Ref Range   Cholesterol, Total 144 100 - 199 mg/dL   Triglycerides 376 (H) 0 - 149 mg/dL   HDL 44 >28 mg/dL   VLDL Cholesterol Cal 28 5 - 40 mg/dL   LDL Chol Calc (NIH) 72 0 - 99 mg/dL   Chol/HDL Ratio 3.3 0.0 - 4.4 ratio    Assessment & Plan     1. Hyperlipidemia associated with type 2 diabetes mellitus (HCC)  Improved, continue statin.   - Lipid Profile  2. Diabetes mellitus, new onset (HCC)  She is scheduling eye exam ~ 06/2019. Counseled this should be DM eye exam.  - HgB A1c  3. Need for vaccination against Streptococcus pneumoniae  Continues to decline pneumonia vaccination. Counseled she will need this vaccination, plan to get it at next visit.    No follow-ups on file.      ITrey Sailors, Pamela Chapman, Pamela reviewed all documentation for this visit. The documentation on 04/09/20 for the exam, diagnosis, procedures, and orders are all accurate and complete.  The entirety of the information documented in the History of Present Illness, Review of Systems and Physical Exam were personally obtained by me. Portions of this information were initially documented by [facility] and reviewed by me for thoroughness and accuracy.     Pamela Chapman  [facility] [phone] (phone) [phone] (fax)  [facility]

## 2020-04-09 LAB — LIPID PANEL
Chol/HDL Ratio: 3.3 ratio (ref 0.0–4.4)
Cholesterol, Total: 144 mg/dL (ref 100–199)
HDL: 44 mg/dL (ref >39)
LDL Chol Calc (NIH): 72 mg/dL (ref 0–99)
Triglycerides: 164 mg/dL — ABNORMAL HIGH (ref 0–149)
VLDL Cholesterol Cal: 28 mg/dL (ref 5–40)

## 2020-04-09 LAB — HEMOGLOBIN A1C
Est. average glucose Bld gHb Est-mCnc: 148 mg/dL
Hgb A1c MFr Bld: 6.8 % — ABNORMAL HIGH (ref 4.8–5.6)

## 2020-08-07 ENCOUNTER — Ambulatory Visit: Payer: Self-pay | Admitting: Physician Assistant

## 2020-08-10 ENCOUNTER — Ambulatory Visit: Payer: Self-pay | Admitting: Family Medicine

## 2021-03-17 ENCOUNTER — Ambulatory Visit (INDEPENDENT_AMBULATORY_CARE_PROVIDER_SITE_OTHER): Payer: BC Managed Care – PPO | Admitting: Family Medicine

## 2021-03-17 ENCOUNTER — Other Ambulatory Visit: Payer: Self-pay

## 2021-03-17 VITALS — BP 165/98 | HR 98 | Temp 98.1°F | Wt 168.0 lb

## 2021-03-17 DIAGNOSIS — J301 Allergic rhinitis due to pollen: Secondary | ICD-10-CM

## 2021-03-17 DIAGNOSIS — E119 Type 2 diabetes mellitus without complications: Secondary | ICD-10-CM

## 2021-03-17 DIAGNOSIS — Z23 Encounter for immunization: Secondary | ICD-10-CM

## 2021-03-17 DIAGNOSIS — E049 Nontoxic goiter, unspecified: Secondary | ICD-10-CM

## 2021-03-17 DIAGNOSIS — E1169 Type 2 diabetes mellitus with other specified complication: Secondary | ICD-10-CM

## 2021-03-17 DIAGNOSIS — I10 Essential (primary) hypertension: Secondary | ICD-10-CM | POA: Diagnosis not present

## 2021-03-17 DIAGNOSIS — E785 Hyperlipidemia, unspecified: Secondary | ICD-10-CM

## 2021-03-17 MED ORDER — METFORMIN HCL 500 MG PO TABS: 500.0000 mg | ORAL_TABLET | Freq: Two times a day (BID) | ORAL | 3 refills | Status: DC

## 2021-03-17 MED ORDER — ROSUVASTATIN CALCIUM 10 MG PO TABS: 10.0000 mg | ORAL_TABLET | Freq: Every day | ORAL | 3 refills | Status: DC

## 2021-03-17 MED ORDER — FLUTICASONE PROPIONATE 50 MCG/ACT NA SUSP: 2.0000 | Freq: Every day | NASAL | 6 refills | Status: DC

## 2021-03-17 MED ORDER — TELMISARTAN 20 MG PO TABS: 20.0000 mg | ORAL_TABLET | Freq: Every day | ORAL | 3 refills | Status: DC

## 2021-03-17 NOTE — Patient Instructions (Addendum)
Start Telmisartan now-this is for blood pressure  Start Metformin in December-this is for diabetes  Start Rosuvastatin in January-this is for cholesterol  Flonase nasal spray to help with congestion and hopefully make your ear feel better

## 2021-03-17 NOTE — Progress Notes (Signed)
Established patient visit   Patient: Pamela Chapman   DOB: 08/21/1965   55 y.o. Female  MRN: [id number] Visit Date: 03/17/2021  Today's healthcare provider: Megan Mans, MD   No chief complaint on file.  Subjective    HPI  Patient with diagnosis of diabetes has had her cholesterol appropriately treated blood her diabetes nor her blood pressure have been acted upon. She has no complaints today other than right ear discomfort and congestion. Diabetes Mellitus Type II, Follow-up  Lab Results  Component Value Date   HGBA1C 6.8 (H) 04/08/2020   HGBA1C 6.9 (A) 11/19/2019   HGBA1C 6.8 (H) 10/15/2019   Wt Readings from Last 3 Encounters:  03/17/21 168 lb (76.2 kg)  04/08/20 168 lb 8 oz (76.4 kg)  03/02/20 160 lb 5.1 oz (72.7 kg)   Last seen for diabetes 0 months ago.  Management since then includes none. Symptoms: No fatigue No foot ulcerations  No appetite changes No nausea  No paresthesia of the feet  No polydipsia  No polyuria No visual disturbances   No vomiting     Pertinent Labs: Lab Results  Component Value Date   CHOL 144 04/08/2020   HDL 44 04/08/2020   LDLCALC 72 04/08/2020   TRIG 164 (H) 04/08/2020   CHOLHDL 3.3 04/08/2020   Lab Results  Component Value Date   NA 138 10/15/2019   K 4.5 10/15/2019   CREATININE 0.80 10/15/2019   GFRNONAA 83 10/15/2019   LABMICR 3.6 11/19/2019     --------------------------------------------------------------------------------------------------- Lipid/Cholesterol, Follow-up  Last lipid panel Other pertinent labs  Lab Results  Component Value Date   CHOL 144 04/08/2020   HDL 44 04/08/2020   LDLCALC 72 04/08/2020   TRIG 164 (H) 04/08/2020   CHOLHDL 3.3 04/08/2020   Lab Results  Component Value Date   ALT 35 (H) 10/15/2019   AST 30 10/15/2019   PLT 310 10/15/2019   TSH 2.020 10/15/2019     She was last seen for this 0 months ago.  Management since that visit includes none.  She reports  poor compliance with treatment. She is having side effects. Patient states her blood pressure went up while on the Atorvastatin and she was having headaches.  She discontinued the use 2 months ago  Symptoms: No chest pain No chest pressure/discomfort  No dyspnea No lower extremity edema  No numbness or tingling of extremity No orthopnea  No palpitations No paroxysmal nocturnal dyspnea  No speech difficulty No syncope    The 10-year ASCVD risk score (Arnett DK, et al., 2019) is: 16.6%  --------------------------------------------------------------------------------------------------- Hypertension, follow-up  BP Readings from Last 3 Encounters:  03/17/21 (!) 165/98  04/08/20 (!) 143/91  03/02/20 118/83   Wt Readings from Last 3 Encounters:  03/17/21 168 lb (76.2 kg)  04/08/20 168 lb 8 oz (76.4 kg)  03/02/20 160 lb 5.1 oz (72.7 kg)     Patient was on Clonidine and states it was not for hypertension but for hot flashes.  She had discontinued this 2 months ago Symptoms: No chest pain No chest pressure  No palpitations No syncope  No dyspnea No orthopnea  No paroxysmal nocturnal dyspnea No lower extremity edema   Pertinent labs: Lab Results  Component Value Date   CHOL 144 04/08/2020   HDL 44 04/08/2020   LDLCALC 72 04/08/2020   TRIG 164 (H) 04/08/2020   CHOLHDL 3.3 04/08/2020   Lab Results  Component Value Date   NA 138 10/15/2019  K 4.5 10/15/2019   CREATININE 0.80 10/15/2019   GFRNONAA 83 10/15/2019   GLUCOSE 118 (H) 10/15/2019   TSH 2.020 10/15/2019     The 10-year ASCVD risk score (Arnett DK, et al., 2019) is: 16.6%   ---------------------------------------------------------------------------------------------------     Medications: Outpatient Medications Prior to Visit  Medication Sig   atorvastatin (LIPITOR) 10 MG tablet Take 1 tablet (10 mg total) by mouth daily. (Patient not taking: Reported on 03/17/2021)   cloNIDine (CATAPRES) 0.1 MG tablet TAKE 1  TABLET(0.1 MG) BY MOUTH DAILY (Patient not taking: Reported on 03/17/2021)   Multiple Vitamin (MULTIVITAMIN ADULT PO) Take by mouth. (Patient not taking: Reported on 03/17/2021)   No facility-administered medications prior to visit.    Review of Systems  Respiratory:  Negative for shortness of breath and wheezing.   Cardiovascular:  Negative for chest pain, palpitations and leg swelling.  Neurological:  Negative for dizziness and headaches.   Last hemoglobin A1c Lab Results  Component Value Date   HGBA1C 6.8 (H) 04/08/2020       Objective    BP (!) 165/98 (BP Location: Right Arm, Patient Position: Sitting, Cuff Size: Normal)   Pulse 98   Temp 98.1 F (36.7 C) (Oral)   Wt 168 lb (76.2 kg)   SpO2 100%   BMI 30.73 kg/m  BP Readings from Last 3 Encounters:  03/17/21 (!) 165/98  04/08/20 (!) 143/91  03/02/20 118/83   Wt Readings from Last 3 Encounters:  03/17/21 168 lb (76.2 kg)  04/08/20 168 lb 8 oz (76.4 kg)  03/02/20 160 lb 5.1 oz (72.7 kg)      Physical Exam Vitals reviewed.  Constitutional:      General: She is not in acute distress.    Appearance: She is well-developed.  HENT:     Head: Normocephalic and atraumatic.     Right Ear: Hearing, tympanic membrane, ear canal and external ear normal.     Left Ear: Hearing, tympanic membrane, ear canal and external ear normal.     Nose: Nose normal.  Eyes:     General: Lids are normal. No scleral icterus.       Right eye: No discharge.        Left eye: No discharge.     Conjunctiva/sclera: Conjunctivae normal.  Cardiovascular:     Rate and Rhythm: Normal rate and regular rhythm.     Heart sounds: Normal heart sounds.  Pulmonary:     Effort: Pulmonary effort is normal. No respiratory distress.  Musculoskeletal:     Right lower leg: No edema.     Left lower leg: No edema.  Skin:    General: Skin is warm and dry.     Findings: No lesion or rash.  Neurological:     General: No focal deficit present.     Mental  Status: She is alert and oriented to person, place, and time.  Psychiatric:        Mood and Affect: Mood normal.        Speech: Speech normal.        Behavior: Behavior normal.        Thought Content: Thought content normal.        Judgment: Judgment normal.      No results found for any visits on 03/17/21.  Assessment & Plan     1. Diabetes mellitus, new onset (HCC) Diabetes needs to be treated so we will start with metformin 500 mg daily.  Follow-up on next visit  with A1c in 3 to 4 months. - Hemoglobin A1c - Comprehensive metabolic panel - CBC with Differential/Platelet  2. Hyperlipidemia associated with type 2 diabetes mellitus (HCC) Will change from atorvastatin to rosuvastatin to try to avoid side effects.  Start this in 2 months. - Lipid Panel With LDL/HDL Ratio - Comprehensive metabolic panel - CBC with Differential/Platelet  3. Enlarged thyroid  - TSH  4. Hypertension, unspecified type Today we will start telmisartan 20 mg daily for high blood pressure. - TSH - Comprehensive metabolic panel - CBC with Differential/Platelet  5. Seasonal allergic rhinitis due to pollen For probable eustachian tube dysfunction due to allergies we will try Flonase nasal spray.   No follow-ups on file.      I, Megan Mans, MD, have reviewed all documentation for this visit. The documentation on 03/17/21 for the exam, diagnosis, procedures, and orders are all accurate and complete.   Janese Radabaugh Wendelyn Breslow, MD  [facility] [phone] (phone) [phone] (fax)  [facility]

## 2021-03-18 LAB — LIPID PANEL WITH LDL/HDL RATIO
Cholesterol, Total: 204 mg/dL — ABNORMAL HIGH (ref 100–199)
HDL: 39 mg/dL — ABNORMAL LOW (ref >39)
LDL Chol Calc (NIH): 117 mg/dL — ABNORMAL HIGH (ref 0–99)
LDL/HDL Ratio: 3 ratio (ref 0.0–3.2)
Triglycerides: 276 mg/dL — ABNORMAL HIGH (ref 0–149)
VLDL Cholesterol Cal: 48 mg/dL — ABNORMAL HIGH (ref 5–40)

## 2021-03-18 LAB — CBC WITH DIFFERENTIAL/PLATELET
Basophils Absolute: 0.1 10*3/uL (ref 0.0–0.2)
Basos: 1 %
EOS (ABSOLUTE): 0.2 10*3/uL (ref 0.0–0.4)
Eos: 3 %
Hematocrit: 41.5 % (ref 34.0–46.6)
Hemoglobin: 13.9 g/dL (ref 11.1–15.9)
Immature Grans (Abs): 0 10*3/uL (ref 0.0–0.1)
Immature Granulocytes: 0 %
Lymphocytes Absolute: 2.6 10*3/uL (ref 0.7–3.1)
Lymphs: 47 %
MCH: 25.5 pg — ABNORMAL LOW (ref 26.6–33.0)
MCHC: 33.5 g/dL (ref 31.5–35.7)
MCV: 76 fL — ABNORMAL LOW (ref 79–97)
Monocytes Absolute: 0.4 10*3/uL (ref 0.1–0.9)
Monocytes: 7 %
Neutrophils Absolute: 2.4 10*3/uL (ref 1.4–7.0)
Neutrophils: 42 %
Platelets: 301 10*3/uL (ref 150–450)
RBC: 5.46 x10E6/uL — ABNORMAL HIGH (ref 3.77–5.28)
RDW: 14.3 % (ref 11.7–15.4)
WBC: 5.6 10*3/uL (ref 3.4–10.8)

## 2021-03-18 LAB — HEMOGLOBIN A1C
Est. average glucose Bld gHb Est-mCnc: 160 mg/dL
Hgb A1c MFr Bld: 7.2 % — ABNORMAL HIGH (ref 4.8–5.6)

## 2021-03-18 LAB — COMPREHENSIVE METABOLIC PANEL
ALT: 26 IU/L (ref 0–32)
AST: 24 IU/L (ref 0–40)
Albumin/Globulin Ratio: 1.8 (ref 1.2–2.2)
Albumin: 5.1 g/dL — ABNORMAL HIGH (ref 3.8–4.9)
Alkaline Phosphatase: 103 IU/L (ref 44–121)
BUN/Creatinine Ratio: 14 (ref 9–23)
BUN: 11 mg/dL (ref 6–24)
Bilirubin Total: <0.2 mg/dL (ref 0.0–1.2)
CO2: 22 mmol/L (ref 20–29)
Calcium: 10 mg/dL (ref 8.7–10.2)
Chloride: 100 mmol/L (ref 96–106)
Creatinine, Ser: 0.78 mg/dL (ref 0.57–1.00)
Globulin, Total: 2.8 g/dL (ref 1.5–4.5)
Glucose: 120 mg/dL — ABNORMAL HIGH (ref 70–99)
Potassium: 4.2 mmol/L (ref 3.5–5.2)
Sodium: 138 mmol/L (ref 134–144)
Total Protein: 7.9 g/dL (ref 6.0–8.5)
eGFR: 89 mL/min/{1.73_m2} (ref >59)

## 2021-03-18 LAB — TSH: TSH: 2.6 u[IU]/mL (ref 0.450–4.500)

## 2021-04-19 ENCOUNTER — Encounter: Payer: BC Managed Care – PPO | Admitting: Family Medicine

## 2021-04-29 ENCOUNTER — Other Ambulatory Visit: Payer: Self-pay

## 2021-04-29 ENCOUNTER — Ambulatory Visit (INDEPENDENT_AMBULATORY_CARE_PROVIDER_SITE_OTHER): Payer: BC Managed Care – PPO | Admitting: Family Medicine

## 2021-04-29 ENCOUNTER — Encounter: Payer: Self-pay | Admitting: Family Medicine

## 2021-04-29 VITALS — BP 138/80 | HR 94 | Resp 16 | Ht 63.0 in | Wt 174.3 lb

## 2021-04-29 DIAGNOSIS — E1165 Type 2 diabetes mellitus with hyperglycemia: Secondary | ICD-10-CM | POA: Diagnosis not present

## 2021-04-29 DIAGNOSIS — E1159 Type 2 diabetes mellitus with other circulatory complications: Secondary | ICD-10-CM | POA: Insufficient documentation

## 2021-04-29 DIAGNOSIS — E785 Hyperlipidemia, unspecified: Secondary | ICD-10-CM

## 2021-04-29 DIAGNOSIS — Z0000 Encounter for general adult medical examination without abnormal findings: Secondary | ICD-10-CM

## 2021-04-29 DIAGNOSIS — E1169 Type 2 diabetes mellitus with other specified complication: Secondary | ICD-10-CM | POA: Diagnosis not present

## 2021-04-29 DIAGNOSIS — Z1231 Encounter for screening mammogram for malignant neoplasm of breast: Secondary | ICD-10-CM | POA: Insufficient documentation

## 2021-04-29 DIAGNOSIS — N951 Menopausal and female climacteric states: Secondary | ICD-10-CM | POA: Insufficient documentation

## 2021-04-29 DIAGNOSIS — I152 Hypertension secondary to endocrine disorders: Secondary | ICD-10-CM

## 2021-04-29 NOTE — Assessment & Plan Note (Signed)
Routine screening mammogram

## 2021-04-29 NOTE — Assessment & Plan Note (Signed)
Chronic, stable Lower at home/work Continue to reinforce diet/exercise and medication compliance

## 2021-04-29 NOTE — Progress Notes (Addendum)
Complete physical exam   Patient: Pamela Chapman   DOB: [birth year]   55 y.o. Female  MRN: [id number] Visit Date: 04/29/2021  Today's healthcare provider: [name], FNP   Chief Complaint  Patient presents with   Annual Exam   Subjective      HPI  Pamela Chapman is a 55 y.o. female who presents today for a complete physical exam.  She reports consuming a general diet. The patient has a physically strenuous job, but has no regular exercise apart from work.  She generally feels well. She reports sleeping well. She does not have additional problems to discuss today. Last Reported Mammo-01/23/2020  History reviewed. No pertinent past medical history. Past Surgical History:  Procedure Laterality Date   ABDOMINAL HYSTERECTOMY     BREAST BIOPSY Left 2011   neg bx   CHOLECYSTECTOMY     COLONOSCOPY WITH PROPOFOL N/A 03/02/2020   Procedure: COLONOSCOPY WITH PROPOFOL;  Surgeon: Toney Reil, MD;  Location: [facility] ENDOSCOPY;  Service: Gastroenterology;  Laterality: N/A;   THYROIDECTOMY, PARTIAL Left    Social History   Socioeconomic History   Marital status: Divorced    Spouse name: Not on file   Number of children: Not on file   Years of education: Not on file   Highest education level: Not on file  Occupational History   Not on file  Tobacco Use   Smoking status: Never   Smokeless tobacco: Never  Vaping Use   Vaping Use: Never used  Substance and Sexual Activity   Alcohol use: Never   Drug use: Never   Sexual activity: Not on file  Other Topics Concern   Not on file  Social History Narrative   Not on file   Social Determinants of Health   Financial Resource Strain: Not on file  Food Insecurity: Not on file  Transportation Needs: Not on file  Physical Activity: Not on file  Stress: Not on file  Social Connections: Not on file  Intimate Partner Violence: Not on file   Family Status  Relation Name Status   Mother  Alive   Father   Alive   MGM  Deceased   MGF  Deceased   Family History  Problem Relation Age of Onset   Hypertension Mother    Arthritis Mother    Hypertension Father    Thyroid cancer Father    Bone cancer Father    Alzheimer's disease Maternal Grandmother    Hypertension Maternal Grandmother    Diabetes Maternal Grandmother    Breast cancer Maternal Grandmother 23   Heart disease Maternal Grandfather    Allergies  Allergen Reactions   Iodinated Diagnostic Agents Anaphylaxis    All Seafood   Shellfish Allergy Anaphylaxis    All Seafood    Banana     Patient Care Team: [name], FNP as PCP - General (Family Medicine)   Medications: Outpatient Medications Prior to Visit  Medication Sig   fluticasone (FLONASE) 50 MCG/ACT nasal spray Place 2 sprays into both nostrils daily.   metFORMIN (GLUCOPHAGE) 500 MG tablet Take 1 tablet (500 mg total) by mouth 2 (two) times daily with a meal.   rosuvastatin (CRESTOR) 10 MG tablet Take 1 tablet (10 mg total) by mouth daily.   telmisartan (MICARDIS) 20 MG tablet Take 1 tablet (20 mg total) by mouth daily.   [DISCONTINUED] atorvastatin (LIPITOR) 10 MG tablet Take 1 tablet (10 mg total) by mouth daily. (Patient not taking: Reported  on 03/17/2021)   [DISCONTINUED] cloNIDine (CATAPRES) 0.1 MG tablet TAKE 1 TABLET(0.1 MG) BY MOUTH DAILY (Patient not taking: Reported on 03/17/2021)   [DISCONTINUED] Multiple Vitamin (MULTIVITAMIN ADULT PO) Take by mouth. (Patient not taking: Reported on 03/17/2021)   No facility-administered medications prior to visit.    Review of Systems  HENT:  Positive for rhinorrhea and sinus pressure.   All other systems reviewed and are negative.  Encouraged to take heart/BP friendly OTC medication to assist with sinus symptoms.    Objective    BP 138/80 Comment: home   Pulse 94    Resp 16    Ht  (1.6 m)    Wt 174 lb 4.8 oz (79.1 kg)    SpO2 100%    BMI 30.88 kg/m    Physical Exam Vitals and nursing note reviewed.   Constitutional:      General: She is awake. She is not in acute distress.    Appearance: Normal appearance. She is well-developed and well-groomed. She is obese. She is not ill-appearing, toxic-appearing or diaphoretic.  HENT:     Head: Normocephalic and atraumatic.     Jaw: There is normal jaw occlusion. No trismus, tenderness, swelling or pain on movement.     Right Ear: Hearing, tympanic membrane, ear canal and external ear normal. There is no impacted cerumen.     Left Ear: Hearing, tympanic membrane, ear canal and external ear normal. There is no impacted cerumen.     Nose: Nose normal. No congestion or rhinorrhea.     Right Turbinates: Not enlarged, swollen or pale.     Left Turbinates: Not enlarged, swollen or pale.     Right Sinus: No maxillary sinus tenderness or frontal sinus tenderness.     Left Sinus: No maxillary sinus tenderness or frontal sinus tenderness.     Mouth/Throat:     Lips: Pink.     Mouth: Mucous membranes are moist. No injury.     Tongue: No lesions.     Pharynx: Oropharynx is clear. Uvula midline. No pharyngeal swelling, oropharyngeal exudate, posterior oropharyngeal erythema or uvula swelling.     Tonsils: No tonsillar exudate or tonsillar abscesses.  Eyes:     General: Lids are normal. Lids are everted, no foreign bodies appreciated. Vision grossly intact. Gaze aligned appropriately. No allergic shiner or visual field deficit.       Right eye: No discharge.        Left eye: No discharge.     Extraocular Movements: Extraocular movements intact.     Conjunctiva/sclera: Conjunctivae normal.     Right eye: Right conjunctiva is not injected. No exudate.    Left eye: Left conjunctiva is not injected. No exudate.    Pupils: Pupils are equal, round, and reactive to light.  Neck:     Thyroid: No thyroid mass, thyromegaly or thyroid tenderness.     Vascular: No carotid bruit.     Trachea: Trachea normal.  Cardiovascular:     Rate and Rhythm: Normal rate and  regular rhythm.     Pulses: Normal pulses.          Carotid pulses are 2+ on the right side and 2+ on the left side.      Radial pulses are 2+ on the right side and 2+ on the left side.       Dorsalis pedis pulses are 2+ on the right side and 2+ on the left side.       Posterior tibial pulses  are 2+ on the right side and 2+ on the left side.     Heart sounds: Normal heart sounds, S1 normal and S2 normal. No murmur heard.   No friction rub. No gallop.  Pulmonary:     Effort: Pulmonary effort is normal. No respiratory distress.     Breath sounds: Normal breath sounds and air entry. No stridor. No wheezing, rhonchi or rales.  Chest:     Chest wall: No tenderness.     Comments: Breasts: breasts appear normal, no suspicious masses, no skin or nipple changes or axillary nodes, right breast normal without mass, skin or nipple changes or axillary nodes, left breast normal without mass, skin or nipple changes or axillary nodes, risk and benefit of breast self-exam was discussed  Abdominal:     General: Abdomen is flat. Bowel sounds are normal. There is no distension.     Palpations: Abdomen is soft. There is no mass.     Tenderness: There is no abdominal tenderness. There is no right CVA tenderness, left CVA tenderness, guarding or rebound.     Hernia: No hernia is present.  Genitourinary:    Comments: Exam deferred; denies complaints Musculoskeletal:        General: No swelling, tenderness, deformity or signs of injury. Normal range of motion.     Cervical back: Full passive range of motion without pain, normal range of motion and neck supple. No edema, rigidity or tenderness. No muscular tenderness.     Right lower leg: No edema.     Left lower leg: No edema.  Lymphadenopathy:     Cervical: No cervical adenopathy.     Right cervical: No superficial, deep or posterior cervical adenopathy.    Left cervical: No superficial, deep or posterior cervical adenopathy.  Skin:    General: Skin is warm  and dry.     Capillary Refill: Capillary refill takes less than 2 seconds.     Coloration: Skin is not jaundiced or pale.     Findings: No bruising, erythema, lesion or rash.  Neurological:     General: No focal deficit present.     Mental Status: She is alert and oriented to person, place, and time. Mental status is at baseline.     GCS: GCS eye subscore is 4. GCS verbal subscore is 5. GCS motor subscore is 6.     Sensory: Sensation is intact. No sensory deficit.     Motor: Motor function is intact. No weakness.     Coordination: Coordination is intact. Coordination normal.     Gait: Gait is intact. Gait normal.  Psychiatric:        Attention and Perception: Attention and perception normal.        Mood and Affect: Mood and affect normal.        Speech: Speech normal.        Behavior: Behavior normal. Behavior is cooperative.        Thought Content: Thought content normal.        Cognition and Memory: Cognition and memory normal.        Judgment: Judgment normal.      Last depression screening scores PHQ 2/9 Scores 03/17/2021 04/08/2020 10/15/2019  PHQ - 2 Score 0 0 0  PHQ- 9 Score 0 0 0   Last fall risk screening Fall Risk  03/17/2021  Falls in the past year? 0  Number falls in past yr: 0  Injury with Fall? 0  Risk for fall due to : -  Follow up -   Last Audit-C alcohol use screening Alcohol Use Disorder Test (AUDIT) 03/17/2021  1. How often do you have a drink containing alcohol? 0  2. How many drinks containing alcohol do you have on a typical day when you are drinking? 0  3. How often do you have six or more drinks on one occasion? 0  AUDIT-C Score 0   A score of 3 or more in women, and 4 or more in men indicates increased risk for alcohol abuse, EXCEPT if all of the points are from question 1   No results found for any visits on 04/29/21.  Assessment & Plan    Routine Health Maintenance and Physical Exam  Exercise Activities and Dietary recommendations  Goals    None     Immunization History  Administered Date(s) Administered   Influenza,inj,Quad PF,6+ Mos 03/17/2021   PFIZER(Purple Top)SARS-COV-2 Vaccination 10/18/2019, 11/29/2019    Health Maintenance  Topic Date Due   Pneumococcal Vaccine 29-39 Years old (1 - PCV) Never done   OPHTHALMOLOGY EXAM  Never done   TETANUS/TDAP  Never done   Zoster Vaccines- Shingrix (1 of 2) Never done   COVID-19 Vaccine (3 - Booster for Pfizer series) 01/24/2020   FOOT EXAM  11/18/2020   HEMOGLOBIN A1C  09/14/2021   MAMMOGRAM  01/22/2022   COLONOSCOPY (Pts 45-72yrs Insurance coverage will need to be confirmed)  03/02/2030   INFLUENZA VACCINE  Completed   Hepatitis C Screening  Completed   HIV Screening  Completed   HPV VACCINES  Aged Out    Discussed health benefits of physical activity, and encouraged her to engage in regular exercise appropriate for her age and condition.  Problem List Items Addressed This Visit       Cardiovascular and Mediastinum   Hot flashes due to menopause    Wax/wane Encouraged to try SSRI, low dose- pt not interested Educated on black cohosh- natural supplementation that can be used that often can help with some symptom relief      Hypertension associated with diabetes (HCC)    Chronic, stable Lower at home/work Continue to reinforce diet/exercise and medication compliance         Endocrine   Hyperlipidemia associated with type 2 diabetes mellitus (HCC)    Plan to repeat lipid panel in 3 months- recent start on statin Tolerating well Recommend increase in diet of healthier fat choices- low fat meats, oils that are not solid at room temperature, nuts, seeds, fish- cod, halibut, salmon, and avocado. Exercise can also increase this number.  Supplemental omega 3's can be taken as well but are not as helpful as dietary/exercise changes.       Type 2 diabetes mellitus with hyperglycemia, without long-term current use of insulin (HCC)    Has not started metformin,  reinforcement provided encouraged to start slow with warning about GI side effects Continue to recommend balanced, lower carb meals. Smaller meal size, adding snacks. Choosing water as drink of choice and increasing purposeful exercise.         Other   Annual physical exam - Primary    UTD on dental/eye visit Things to do to keep yourself healthy  - Exercise at least 30-45 minutes a day, 3-4 days a week.  - Eat a low-fat diet with lots of fruits and vegetables, up to 7-9 servings per day.  - Seatbelts can save your life. Wear them always.  - Smoke detectors on every level of your home, check batteries  every year.  - Eye Doctor - have an eye exam every 1-2 years  - Safe sex - if you may be exposed to STDs, use a condom.  - Alcohol -  If you drink, do it moderately, less than 2 drinks per day.  - Health Care Power of Attorney. Choose someone to speak for you if you are not able.  - Depression is common in our stressful world.If you're feeling down or losing interest in things you normally enjoy, please come in for a visit.  - Violence - If anyone is threatening or hurting you, please call immediately.        Encounter for screening mammogram for malignant neoplasm of breast    Routine screening mammogram      Relevant Orders   MM 3D SCREEN BREAST BILATERAL     Return in about 3 months (around 07/28/2021) for chonic disease management, T2DM management.     Leilani Merl, FNP, have reviewed all documentation for this visit. The documentation on 04/29/21 for the exam, diagnosis, procedures, and orders are all accurate and complete.    [name], FNP  [facility] [phone] (phone) [phone] (fax)  [facility]

## 2021-04-29 NOTE — Assessment & Plan Note (Signed)
Plan to repeat lipid panel in 3 months- recent start on statin Tolerating well Recommend increase in diet of healthier fat choices- low fat meats, oils that are not solid at room temperature, nuts, seeds, fish- cod, halibut, salmon, and avocado. Exercise can also increase this number.  Supplemental omega 3's can be taken as well but are not as helpful as dietary/exercise changes.

## 2021-04-29 NOTE — Assessment & Plan Note (Signed)
Has not started metformin, reinforcement provided encouraged to start slow with warning about GI side effects Continue to recommend balanced, lower carb meals. Smaller meal size, adding snacks. Choosing water as drink of choice and increasing purposeful exercise.

## 2021-04-29 NOTE — Assessment & Plan Note (Signed)
UTD on dental/eye visit Things to do to keep yourself healthy  - Exercise at least 30-45 minutes a day, 3-4 days a week.  - Eat a low-fat diet with lots of fruits and vegetables, up to 7-9 servings per day.  - Seatbelts can save your life. Wear them always.  - Smoke detectors on every level of your home, check batteries every year.  - Eye Doctor - have an eye exam every 1-2 years  - Safe sex - if you may be exposed to STDs, use a condom.  - Alcohol -  If you drink, do it moderately, less than 2 drinks per day.  - Health Care Power of Attorney. Choose someone to speak for you if you are not able.  - Depression is common in our stressful world.If you're feeling down or losing interest in things you normally enjoy, please come in for a visit.  - Violence - If anyone is threatening or hurting you, please call immediately.

## 2021-04-29 NOTE — Assessment & Plan Note (Signed)
Wax/wane Encouraged to try SSRI, low dose- pt not interested Educated on black cohosh- natural supplementation that can be used that often can help with some symptom relief

## 2021-07-27 NOTE — Progress Notes (Signed)
?  ? ?I,Elena D DeSanto,acting as a scribe for Gwyneth Sprout, FNP.,have documented all relevant documentation on the behalf of Gwyneth Sprout, FNP,as directed by  Gwyneth Sprout, FNP while in the presence of Gwyneth Sprout, FNP. ? ? ?Established patient visit ? ? ?Patient: Pamela Chapman   DOB: 03/30/1966   56 y.o. Female  MRN: [id number] ?Visit Date: 07/28/2021 ? ?Today's healthcare provider: Gwyneth Sprout, FNP  ?Re Introduced to nurse practitioner role and practice setting.  All questions answered.  Discussed provider/patient relationship and expectations. ? ?Subjective  ?  ?HPI  ?Diabetes Mellitus Type II, Follow-up ? ?Lab Results  ?Component Value Date  ? HGBA1C 6.9 (A) 07/28/2021  ? HGBA1C 7.2 (H) 03/17/2021  ? HGBA1C 6.8 (H) 04/08/2020  ? ?Wt Readings from Last 3 Encounters:  ?07/28/21 172 lb (78 kg)  ?04/29/21 174 lb 4.8 oz (79.1 kg)  ?03/17/21 168 lb (76.2 kg)  ? ?Last seen for diabetes 3 months ago.  ?Management since then includes none. ?She reports good compliance with treatment. ?She is not having side effects.  ? ?Symptoms: ?No fatigue No foot ulcerations  ?No appetite changes No nausea  ?No paresthesia of the feet  No polydipsia  ?No polyuria No visual disturbances   ?No vomiting   ? ? ?Home blood sugar records:  not being checked ? ?Episodes of hypoglycemia? No  ?  ?Current insulin regiment: none ?Most Recent Eye Exam: within the last year, will obtain records ?Current exercise: walking ?Current diet habits: none  Following "regular" diet ? ?Pertinent Labs: ?Lab Results  ?Component Value Date  ? CHOL 204 (H) 03/17/2021  ? HDL 39 (L) 03/17/2021  ? LDLCALC 117 (H) 03/17/2021  ? TRIG 276 (H) 03/17/2021  ? CHOLHDL 3.3 04/08/2020  ? Lab Results  ?Component Value Date  ? NA 138 03/17/2021  ? K 4.2 03/17/2021  ? CREATININE 0.78 03/17/2021  ? EGFR 89 03/17/2021  ? LABMICR 3.6 11/19/2019  ?   ? ?--------------------------------------------------------------------------------------------------- ? ? ?Medications: ?Outpatient Medications Prior to Visit  ?Medication Sig  ? fluticasone (FLONASE) 50 MCG/ACT nasal spray Place 2 sprays into both nostrils daily.  ? metFORMIN (GLUCOPHAGE) 500 MG tablet Take 1 tablet (500 mg total) by mouth 2 (two) times daily with a meal.  ? rosuvastatin (CRESTOR) 10 MG tablet Take 1 tablet (10 mg total) by mouth daily.  ? telmisartan (MICARDIS) 20 MG tablet Take 1 tablet (20 mg total) by mouth daily.  ? ?No facility-administered medications prior to visit.  ? ? ?Review of Systems ? ?  ?  Objective  ?  ?BP 134/88 Comment: home  Pulse (!) 103   Temp 98.7 ?F (37.1 ?C) (Oral)   Wt 172 lb (78 kg)   SpO2 98%   BMI 30.47 kg/m?  ?  ? ?Physical Exam ?Vitals and nursing note reviewed.  ?Constitutional:   ?   General: She is not in acute distress. ?   Appearance: Normal appearance. She is obese. She is not ill-appearing, toxic-appearing or diaphoretic.  ?HENT:  ?   Head: Normocephalic and atraumatic.  ?Cardiovascular:  ?   Rate and Rhythm: Regular rhythm. Tachycardia present.  ?   Pulses: Normal pulses.  ?   Heart sounds: Normal heart sounds. No murmur heard. ?  No friction rub. No gallop.  ?Pulmonary:  ?   Effort: Pulmonary effort is normal. No respiratory distress.  ?   Breath sounds: Normal breath sounds. No stridor. No wheezing, rhonchi or rales.  ?  Chest:  ?   Chest wall: No tenderness.  ?Abdominal:  ?   General: Bowel sounds are normal.  ?   Palpations: Abdomen is soft.  ?Musculoskeletal:     ?   General: No swelling, tenderness, deformity or signs of injury. Normal range of motion.  ?   Right lower leg: No edema.  ?   Left lower leg: No edema.  ?Skin: ?   General: Skin is warm and dry.  ?   Capillary Refill: Capillary refill takes less than 2 seconds.  ?   Coloration: Skin is not jaundiced or pale.  ?   Findings: No bruising, erythema, lesion or rash.  ?Neurological:  ?    General: No focal deficit present.  ?   Mental Status: She is alert and oriented to person, place, and time. Mental status is at baseline.  ?   Cranial Nerves: No cranial nerve deficit.  ?   Sensory: No sensory deficit.  ?   Motor: No weakness.  ?   Coordination: Coordination normal.  ?Psychiatric:     ?   Mood and Affect: Mood normal.     ?   Behavior: Behavior normal.     ?   Thought Content: Thought content normal.     ?   Judgment: Judgment normal.  ?  ? ? ?Results for orders placed or performed in visit on 07/28/21  ?POCT glycosylated hemoglobin (Hb A1C)  ?Result Value Ref Range  ? Hemoglobin A1C 6.9 (A) 4.0 - 5.6 %  ? HbA1c POC (<> result, manual entry)    ? HbA1c, POC (prediabetic range)    ? HbA1c, POC (controlled diabetic range)    ? ? Assessment & Plan  ?  ? ?Problem List Items Addressed This Visit   ? ?  ? Cardiovascular and Mediastinum  ? Hot flashes due to menopause  ?  Chronic, stable ?Discussed option of low dose Paxil, 10 mg qHS, SSRI, to assist; patient wishes to begin more natural option ?Discussed use of black cohosh, available OTC, to assist ?RTC if symptoms worsen, plan to start Paxil or refer to GYN for low dose estrogen start ?  ?  ? Hypertension associated with diabetes ([place])  ?  Chronic, stable on home readings  ?Reinforce DASH diet and stress management techniques ?Slight tachycardia present today, low 100s, patient reports frustration with "insurance" over copays ?Denies CP ?Denies SOB/ DOE ?Denies low blood pressure/hypotension ?Denies vision changes ?No LE Edema noted on exam ?Continue medication, Telmisartan 20 mg QD ?Denies side effects ?Refills Stable ?Seek emergent care if you develop chest pain or chest pressure ? ?  ?  ?  ? Endocrine  ? Type 2 diabetes mellitus with hyperglycemia, without long-term current use of insulin (HCC) - Primary  ?  Well controlled with last A1c 6.9% today ?Labs consistent for HYPERGLYCEMIA ?Continue current medications, Metformin 500 mg BID ?UTD on  vaccines- patient will check on tetanus records, eye exam-records sent for today, foot exam- completed today ?On ACEi/ARB- Telmisartan 20 mg QD ?On Statin- Crestor 10 mg QD ?Discussed diet and exercise ?F/u in 9 months with CPE ?Pt unable to provide urine sample today for urine micro albumin; RTC if able, or will catch at CPE ?  ?  ? Relevant Orders  ? POCT glycosylated hemoglobin (Hb A1C) (Completed)  ? ? ? ?Return in about 9 months (around 04/29/2022) for annual examination with urine micro.  ?   ? ?I, Gwyneth Sprout, FNP, have reviewed  all documentation for this visit. The documentation on 07/28/21 for the exam, diagnosis, procedures, and orders are all accurate and complete. ? ? ? ?Gwyneth Sprout, FNP  ?[place] ?[phone] (phone) ?[phone] (fax) ? ?[facility] ?

## 2021-07-28 ENCOUNTER — Ambulatory Visit (INDEPENDENT_AMBULATORY_CARE_PROVIDER_SITE_OTHER): Payer: BC Managed Care – PPO | Admitting: Family Medicine

## 2021-07-28 ENCOUNTER — Encounter: Payer: Self-pay | Admitting: Family Medicine

## 2021-07-28 ENCOUNTER — Other Ambulatory Visit: Payer: Self-pay

## 2021-07-28 VITALS — BP 134/88 | HR 103 | Temp 98.7°F | Wt 172.0 lb

## 2021-07-28 DIAGNOSIS — E1165 Type 2 diabetes mellitus with hyperglycemia: Secondary | ICD-10-CM

## 2021-07-28 DIAGNOSIS — E1159 Type 2 diabetes mellitus with other circulatory complications: Secondary | ICD-10-CM

## 2021-07-28 DIAGNOSIS — N951 Menopausal and female climacteric states: Secondary | ICD-10-CM | POA: Diagnosis not present

## 2021-07-28 DIAGNOSIS — I152 Hypertension secondary to endocrine disorders: Secondary | ICD-10-CM

## 2021-07-28 LAB — POCT GLYCOSYLATED HEMOGLOBIN (HGB A1C): Hemoglobin A1C: 6.9 % — AB (ref 4.0–5.6)

## 2021-07-28 NOTE — Assessment & Plan Note (Addendum)
Well controlled with last A1c 6.9% today ?Labs consistent for HYPERGLYCEMIA ?Continue current medications, Metformin 500 mg BID ?UTD on vaccines- patient will check on tetanus records, eye exam-records sent for today, foot exam- completed today ?On ACEi/ARB- Telmisartan 20 mg QD ?On Statin- Crestor 10 mg QD ?Discussed diet and exercise ?F/u in 9 months with CPE ?Pt unable to provide urine sample today for urine micro albumin; RTC if able, or will catch at CPE ?

## 2021-07-28 NOTE — Assessment & Plan Note (Addendum)
Chronic, stable on home readings  ?Reinforce DASH diet and stress management techniques ?Slight tachycardia present today, low 100s, patient reports frustration with "insurance" over copays ?Denies CP ?Denies SOB/ DOE ?Denies low blood pressure/hypotension ?Denies vision changes ?No LE Edema noted on exam ?Continue medication, Telmisartan 20 mg QD ?Denies side effects ?Refills Stable ?Seek emergent care if you develop chest pain or chest pressure ? ?

## 2021-07-28 NOTE — Assessment & Plan Note (Addendum)
Chronic, stable ?Discussed option of low dose Paxil, 10 mg qHS, SSRI, to assist; patient wishes to begin more natural option ?Discussed use of black cohosh, available OTC, to assist ?RTC if symptoms worsen, plan to start Paxil or refer to GYN for low dose estrogen start ?

## 2021-07-29 ENCOUNTER — Telehealth: Payer: Self-pay

## 2021-07-29 NOTE — Telephone Encounter (Signed)
Spoke to patient and she will be in touch with [facility] for those records ? ?Copied from CRM [id number]. Topic: Referral - Question ?>> Jul 29, 2021  8:41 AM Payton Spark N wrote: ?Reason for CRM: Agustin Cree from [facility] [phone] called in, in reference to the referral that was sent over, stating they can not find the pt by name, they were requesting if the office could reach out to the pt, so she can reach out to them to get a little more information to be able to pull her up. Please advise. ?

## 2021-07-29 NOTE — Telephone Encounter (Signed)
Per [facility] patient stated no Diabetes, no eye exam for diabetes was done. ?

## 2021-07-29 NOTE — Telephone Encounter (Signed)
-----   Message from [name], FNP sent at 07/29/2021  1:56 PM EDT ----- ?Thanks for agreeing to help me; please call [employer] to ensure they know the pt is DM. ?----- Message ----- ?From: Latanya Maudlin ?Sent: 07/29/2021   1:36 PM EDT ?To: [name], FNP ? ?Incoming Fax needs reviewing: ? ? ? ?

## 2021-09-02 ENCOUNTER — Ambulatory Visit
Admission: RE | Admit: 2021-09-02 | Discharge: 2021-09-02 | Disposition: A | Discharge status: Home / Self Care | Payer: BC Managed Care – PPO | Source: Ambulatory Visit | Attending: Family Medicine | Admitting: Family Medicine

## 2021-09-02 DIAGNOSIS — Z1231 Encounter for screening mammogram for malignant neoplasm of breast: Secondary | ICD-10-CM | POA: Insufficient documentation

## 2021-10-11 ENCOUNTER — Other Ambulatory Visit: Payer: Self-pay | Admitting: Family Medicine

## 2022-03-21 ENCOUNTER — Other Ambulatory Visit: Payer: Self-pay

## 2022-03-21 ENCOUNTER — Telehealth: Payer: Self-pay | Admitting: Family Medicine

## 2022-03-21 DIAGNOSIS — E1165 Type 2 diabetes mellitus with hyperglycemia: Secondary | ICD-10-CM

## 2022-03-21 MED ORDER — METFORMIN HCL 500 MG PO TABS: 500.0000 mg | ORAL_TABLET | Freq: Two times a day (BID) | ORAL | 3 refills | Status: DC

## 2022-03-21 NOTE — Telephone Encounter (Signed)
[facility] pharmacy faxed refill request for the following medications:  metFORMIN (GLUCOPHAGE) 500 MG tablet    Please advise  

## 2022-03-22 ENCOUNTER — Emergency Department: Payer: BC Managed Care – PPO

## 2022-03-22 ENCOUNTER — Other Ambulatory Visit: Payer: Self-pay

## 2022-03-22 ENCOUNTER — Encounter: Payer: Self-pay | Admitting: Emergency Medicine

## 2022-03-22 ENCOUNTER — Emergency Department
Admission: EM | Admit: 2022-03-22 | Discharge: 2022-03-22 | Disposition: A | Discharge status: Home / Self Care | Payer: BC Managed Care – PPO | Attending: Emergency Medicine | Admitting: Emergency Medicine

## 2022-03-22 DIAGNOSIS — R110 Nausea: Secondary | ICD-10-CM | POA: Diagnosis not present

## 2022-03-22 DIAGNOSIS — R1013 Epigastric pain: Secondary | ICD-10-CM | POA: Diagnosis present

## 2022-03-22 DIAGNOSIS — Z1152 Encounter for screening for COVID-19: Secondary | ICD-10-CM | POA: Insufficient documentation

## 2022-03-22 DIAGNOSIS — E119 Type 2 diabetes mellitus without complications: Secondary | ICD-10-CM | POA: Insufficient documentation

## 2022-03-22 DIAGNOSIS — R42 Dizziness and giddiness: Secondary | ICD-10-CM | POA: Insufficient documentation

## 2022-03-22 LAB — URINALYSIS, ROUTINE W REFLEX MICROSCOPIC
Bilirubin Urine: NEGATIVE
Glucose, UA: NEGATIVE mg/dL
Hgb urine dipstick: NEGATIVE
Ketones, ur: NEGATIVE mg/dL
Leukocytes,Ua: NEGATIVE
Nitrite: NEGATIVE
Protein, ur: NEGATIVE mg/dL
Specific Gravity, Urine: 1.003 — ABNORMAL LOW (ref 1.005–1.030)
pH: 7 (ref 5.0–8.0)

## 2022-03-22 LAB — RESP PANEL BY RT-PCR (FLU A&B, COVID) ARPGX2
Influenza A by PCR: NEGATIVE
Influenza B by PCR: NEGATIVE
SARS Coronavirus 2 by RT PCR: NEGATIVE

## 2022-03-22 LAB — BASIC METABOLIC PANEL
Anion gap: 11 (ref 5–15)
BUN: 13 mg/dL (ref 6–20)
CO2: 23 mmol/L (ref 22–32)
Calcium: 9.3 mg/dL (ref 8.9–10.3)
Chloride: 102 mmol/L (ref 98–111)
Creatinine, Ser: 0.72 mg/dL (ref 0.44–1.00)
GFR, Estimated: >60 mL/min (ref >60)
Glucose, Bld: 166 mg/dL — ABNORMAL HIGH (ref 70–99)
Potassium: 4 mmol/L (ref 3.5–5.1)
Sodium: 136 mmol/L (ref 135–145)

## 2022-03-22 LAB — CBC
HCT: 43 % (ref 36.0–46.0)
Hemoglobin: 13.8 g/dL (ref 12.0–15.0)
MCH: 24.9 pg — ABNORMAL LOW (ref 26.0–34.0)
MCHC: 32.1 g/dL (ref 30.0–36.0)
MCV: 77.5 fL — ABNORMAL LOW (ref 80.0–100.0)
Platelets: 337 10*3/uL (ref 150–400)
RBC: 5.55 MIL/uL — ABNORMAL HIGH (ref 3.87–5.11)
RDW: 14.6 % (ref 11.5–15.5)
WBC: 7.5 10*3/uL (ref 4.0–10.5)
nRBC: 0 % (ref 0.0–0.2)

## 2022-03-22 LAB — HEPATIC FUNCTION PANEL
ALT: 39 U/L (ref 0–44)
AST: 41 U/L (ref 15–41)
Albumin: 4.9 g/dL (ref 3.5–5.0)
Alkaline Phosphatase: 89 U/L (ref 38–126)
Bilirubin, Direct: <0.1 mg/dL (ref 0.0–0.2)
Total Bilirubin: 0.6 mg/dL (ref 0.3–1.2)
Total Protein: 8.9 g/dL — ABNORMAL HIGH (ref 6.5–8.1)

## 2022-03-22 LAB — LIPASE, BLOOD: Lipase: 35 U/L (ref 11–51)

## 2022-03-22 LAB — TROPONIN I (HIGH SENSITIVITY): Troponin I (High Sensitivity): 3 ng/L (ref <18)

## 2022-03-22 MED ORDER — MORPHINE SULFATE (PF) 4 MG/ML IV SOLN
4.0000 mg | Freq: Once | INTRAVENOUS | Status: AC
  Administered 2022-03-22: 4 mg via INTRAVENOUS
  Filled 2022-03-22: qty 1

## 2022-03-22 MED ORDER — ONDANSETRON HCL 4 MG/2ML IJ SOLN
4.0000 mg | Freq: Once | INTRAMUSCULAR | Status: AC
  Administered 2022-03-22: 4 mg via INTRAVENOUS
  Filled 2022-03-22: qty 2

## 2022-03-22 MED ORDER — ONDANSETRON 4 MG PO TBDP
4.0000 mg | ORAL_TABLET | Freq: Once | ORAL | Status: AC
  Administered 2022-03-22: 4 mg via ORAL
  Filled 2022-03-22: qty 1

## 2022-03-22 MED ORDER — ALUM & MAG HYDROXIDE-SIMETH 200-200-20 MG/5ML PO SUSP
30.0000 mL | Freq: Once | ORAL | Status: AC
  Administered 2022-03-22: 30 mL via ORAL
  Filled 2022-03-22: qty 30

## 2022-03-22 MED ORDER — LIDOCAINE VISCOUS HCL 2 % MT SOLN
15.0000 mL | Freq: Once | OROMUCOSAL | Status: AC
  Administered 2022-03-22: 15 mL via OROMUCOSAL
  Filled 2022-03-22: qty 15

## 2022-03-22 MED ORDER — SODIUM CHLORIDE 0.9 % IV SOLN: Freq: Once | INTRAVENOUS | Status: AC

## 2022-03-22 MED ORDER — FAMOTIDINE 20 MG PO TABS: 20.0000 mg | ORAL_TABLET | Freq: Two times a day (BID) | ORAL | 0 refills | Status: DC

## 2022-03-22 NOTE — ED Provider Notes (Signed)
[facility] Provider Note    Event Date/Time   First MD Initiated Contact with Patient 03/22/22 1237     (approximate)   History   Dizziness and Nausea   HPI  Pamela Chapman is a 56 y.o. female with history of diabetes who presents with complaints of epigastric discomfort, nausea and some lightheadedness.  Patient reports she developed epigastric discomfort several days ago, then followed by nausea.  She reports decreased p.o. intake and is feeling somewhat lightheaded.  Denies headache.  No neurodeficits.  No chest pain.  No shortness of breath cough or pleurisy.     Physical Exam   Triage Vital Signs: ED Triage Vitals [03/22/22 1217]  Enc Vitals Group     BP (!) 143/87     Pulse Rate (!) 106     Resp 18     Temp 97.7 F (36.5 C)     Temp Source Axillary     SpO2 98 %     Weight 74.8 kg (165 lb)     Height 1.575 m (5\' 2" )     Head Circumference      Peak Flow      Pain Score 0     Pain Loc      Pain Edu?      Excl. in [place]?     Most recent vital signs: Vitals:   03/22/22 1510 03/22/22 1657  BP: (!) 149/90 138/88  Pulse: 94 90  Resp: 20 18  Temp:  98 F (36.7 C)  SpO2: 95% 96%     General: Awake, no distress.  CV:  Good peripheral perfusion.  Mild tachycardia Resp:  Normal effort.  Clear to auscultation bilaterally Abd:  No distention.  Mild epigastric tenderness Other:     ED Results / Procedures / Treatments   Labs (all labs ordered are listed, but only abnormal results are displayed) Labs Reviewed  BASIC METABOLIC PANEL - Abnormal; Notable for the following components:      Result Value   Glucose, Bld 166 (*)    All other components within normal limits  CBC - Abnormal; Notable for the following components:   RBC 5.55 (*)    MCV 77.5 (*)    MCH 24.9 (*)    All other components within normal limits  URINALYSIS, ROUTINE W REFLEX MICROSCOPIC - Abnormal; Notable for the following components:   Color, Urine STRAW  (*)    APPearance HAZY (*)    Specific Gravity, Urine 1.003 (*)    All other components within normal limits  HEPATIC FUNCTION PANEL - Abnormal; Notable for the following components:   Total Protein 8.9 (*)    All other components within normal limits  RESP PANEL BY RT-PCR (FLU A&B, COVID) ARPGX2  LIPASE, BLOOD  TROPONIN I (HIGH SENSITIVITY)     EKG  ED ECG REPORT I, Lavonia Drafts, the attending physician, personally viewed and interpreted this ECG.  Date: 03/22/2022  Rhythm: normal sinus rhythm QRS Axis: normal Intervals: normal ST/T Wave abnormalities: normal Narrative Interpretation: no evidence of acute ischemia    RADIOLOGY CT abdomen pelvis viewed interpreted by me, no acute abnormality, pending radiology review    PROCEDURES:  Critical Care performed:   Procedures   MEDICATIONS ORDERED IN ED: Medications  ondansetron (ZOFRAN-ODT) disintegrating tablet 4 mg (4 mg Oral Given 03/22/22 1310)  alum & mag hydroxide-simeth (MAALOX/MYLANTA) 200-200-20 MG/5ML suspension 30 mL (30 mLs Oral Given 03/22/22 1310)  lidocaine (XYLOCAINE) 2 % viscous mouth  solution 15 mL (15 mLs Mouth/Throat Given 03/22/22 1310)  morphine (PF) 4 MG/ML injection 4 mg (4 mg Intravenous Given 03/22/22 1423)  ondansetron (ZOFRAN) injection 4 mg (4 mg Intravenous Given 03/22/22 1421)  0.9 %  sodium chloride infusion (0 mLs Intravenous Stopped 03/22/22 1501)     IMPRESSION / MDM / ASSESSMENT AND PLAN / ED COURSE  I reviewed the triage vital signs and the nursing notes. Patient's presentation is most consistent with acute presentation with potential threat to life or bodily function.  Patient presents with abdominal pain and lightheadedness as detailed above.  Differential includes gastritis, GERD, esophagitis, pancreatitis  Trial of GI cocktail did not help her symptoms, will give IV morphine, IV Zofran, IV fluids and obtain CT abdomen pelvis  Lab work reviewed and is reassuring, lipase  is normal, pending LFTs  CT scan is without acute abnormality, evidence of fatty liver which could be causing discomfort.  Reevaluated the patient and she is mildly tachypneic, question whether this could be a pneumonia causing tachycardia and tachypnea  We will obtain chest x-ray, COVID, flu swabs  Have asked my colleague to follow-up on these results.      FINAL CLINICAL IMPRESSION(S) / ED DIAGNOSES   Final diagnoses:  Epigastric pain     Rx / DC Orders   ED Discharge Orders          Ordered    famotidine (PEPCID) 20 MG tablet  2 times daily        03/22/22 1649             Note:  This document was prepared using Dragon voice recognition software and may include unintentional dictation errors.   Jene Every, MD 03/27/22 [phone]

## 2022-03-22 NOTE — ED Notes (Signed)
Lt grn tube sent to lab for repeat trop.

## 2022-03-22 NOTE — Discharge Instructions (Addendum)
Your blood work urine sample and CAT scan were all reassuring.  You can take the Pepcid twice a day for abdominal pain.  Please follow-up with your primary doctor.

## 2022-03-22 NOTE — ED Notes (Signed)
Called lab staff to add on HFP; personnel who picked up phone stated to call back in about 5 minutes as necessary staff member unavailable currently.

## 2022-03-22 NOTE — ED Notes (Signed)
Called lab again. Tayvon in lab stated HFP added on.

## 2022-03-22 NOTE — ED Triage Notes (Signed)
Patient to ED for nausea and dizziness. Patient states symptoms have been ongoing for the past 3 days. NAD noted, ambulatory to triage.

## 2022-03-22 NOTE — ED Provider Notes (Signed)
Patient was signed out to me pending a troponin hepatic function panel chest x-ray and reassessment.  56 year old female presenting with epigastric discomfort.  Patient's chest x-ray is clear troponin is negative hepatic function panel is reassuring.  Patient says she feels improved on reassessment.  She is tolerating p.o.  Will discharge with prescription for Pepcid.  Discussed follow-up with PCP.   Georga Hacking, MD 03/22/22 [phone]

## 2022-03-22 NOTE — ED Notes (Signed)
Pt somewhat unsteady walking back from restroom; pt confirmed when asked that her legs feel a little weak, she feels a little off balance, and she has a mild HA.

## 2022-03-22 NOTE — ED Notes (Signed)
Pt leaving for CXR.  

## 2022-03-22 NOTE — ED Notes (Signed)
Pt leaving for imaging. Will provide meds once pt back to room.

## 2022-03-22 NOTE — ED Notes (Signed)
See triage note, pt denies fever, diarrhea, constipation, burning upon urination, frequency of urination; pt is non-tender in abdomen, reports indigestion, denies CP; feels SOB while resting or walking; pt reports baseline "pain down R arm bc of sciatica"; denies new arm, jaw or shoulder blade pain or numbness. Pt's resp reg/unlabored, skin dry and resting calmly on stretcher. Stretcher locked low, rail up, call bell within reach.

## 2022-03-22 NOTE — ED Notes (Signed)
Pt offered wheelchair to restroom; pt declined stating that she does not feel too SOB or dizzy.

## 2022-03-22 NOTE — ED Notes (Signed)
EDP Kinner to bedside.  

## 2022-03-22 NOTE — ED Notes (Signed)
Called lab to add on trop; Pamela Chapman states she will add it on now if there is enough blood left to run it off of; this RN told Pamela Chapman she will send a second tube to be repeat or first collect if not enough left in first lt grn tube.

## 2022-03-22 NOTE — ED Notes (Signed)
Pt agrees to use call bell when urine sample available for this RN or other staff member to send to lab.

## 2022-03-24 ENCOUNTER — Telehealth: Payer: Self-pay

## 2022-03-24 NOTE — Telephone Encounter (Signed)
Transition Care Management Unsuccessful Follow-up Telephone Call  Date of discharge and from where:  [facility] 03-22-22 Dx: epigastric pain   Attempts:  1st Attempt  Reason for unsuccessful TCM follow-up call:  Unable to leave message   Woodfin Ganja LPN [facility] Nurse Health Advisor Direct Dial [phone]

## 2022-03-25 ENCOUNTER — Telehealth: Payer: Self-pay

## 2022-03-25 NOTE — Telephone Encounter (Signed)
Transition Care Management Unsuccessful Follow-up Telephone Call  Date of discharge and from where:  [facility] ED 03/22/2022  Attempts:  2nd Attempt  Reason for unsuccessful TCM follow-up call:  Left voice message Karena Addison, LPN [facility] Nurse Health Advisor Direct Dial [phone]

## 2022-03-28 NOTE — Telephone Encounter (Signed)
Transition Care Management Unsuccessful Follow-up Telephone Call  Date of discharge and from where:  [facility] 03/22/2022  Attempts:  3rd Attempt  Reason for unsuccessful TCM follow-up call:  Left voice message Karena Addison, LPN [facility] Nurse Health Advisor Direct Dial [phone]

## 2022-04-06 ENCOUNTER — Ambulatory Visit: Payer: BC Managed Care – PPO | Admitting: Family Medicine

## 2022-05-10 ENCOUNTER — Encounter: Payer: BC Managed Care – PPO | Admitting: Family Medicine

## 2022-05-26 ENCOUNTER — Other Ambulatory Visit: Payer: Self-pay | Admitting: Family Medicine

## 2022-08-22 NOTE — Progress Notes (Unsigned)
I,J'ya E Cypress Hinkson,acting as a scribe for [name], FNP.,have documented all relevant documentation on the behalf of [name], FNP,as directed by  [name], FNP while in the presence of [name], FNP.  Complete physical exam  Patient: Pamela Chapman   DOB: November 28, 1965   57 y.o. Female  MRN: [id number] Visit Date: 08/23/2022  Today's healthcare provider: [name], FNP  Re Introduced to nurse practitioner role and practice setting.  All questions answered.  Discussed provider/patient relationship and expectations.  Chief Complaint  Patient presents with   Annual Exam   Subjective    Pamela Chapman is a 57 y.o. female who presents today for a complete physical exam.  She reports consuming a general diet. Gym/ health club routine includes cardio, light weights, treadmill, and walking on track . She generally feels well. She reports sleeping well. She does have additional problems to discuss today.  HPI  Patient has been having ear aches, she reports it might be from pollen.   History reviewed. No pertinent past medical history. Past Surgical History:  Procedure Laterality Date   ABDOMINAL HYSTERECTOMY     BREAST BIOPSY Left 2011   neg bx   CHOLECYSTECTOMY     COLONOSCOPY WITH PROPOFOL N/A 03/02/2020   Procedure: COLONOSCOPY WITH PROPOFOL;  Surgeon: Toney Reil, MD;  Location: [facility] ENDOSCOPY;  Service: Gastroenterology;  Laterality: N/A;   THYROIDECTOMY, PARTIAL Left    Social History   Socioeconomic History   Marital status: Divorced    Spouse name: Not on file   Number of children: Not on file   Years of education: Not on file   Highest education level: Not on file  Occupational History   Not on file  Tobacco Use   Smoking status: Never   Smokeless tobacco: Never  Vaping Use   Vaping Use: Never used  Substance and Sexual Activity   Alcohol use: Never   Drug use: Never   Sexual activity: Yes  Other Topics Concern   Not on  file  Social History Narrative   Not on file   Social Determinants of Health   Financial Resource Strain: Not on file  Food Insecurity: Not on file  Transportation Needs: Not on file  Physical Activity: Not on file  Stress: Not on file  Social Connections: Not on file  Intimate Partner Violence: Not on file   Family Status  Relation Name Status   Mother  Alive   Father  Alive   MGM  Deceased   MGF  Deceased   Family History  Problem Relation Age of Onset   Hypertension Mother    Arthritis Mother    Hypertension Father    Thyroid cancer Father    Bone cancer Father    Alzheimer's disease Maternal Grandmother    Hypertension Maternal Grandmother    Diabetes Maternal Grandmother    Breast cancer Maternal Grandmother 38   Heart disease Maternal Grandfather    Allergies  Allergen Reactions   Iodinated Contrast Media Anaphylaxis    All Seafood   Shellfish Allergy Anaphylaxis    All Seafood    Banana     Patient Care Team: [name], FNP as PCP - General (Family Medicine)   Medications: Outpatient Medications Prior to Visit  Medication Sig   [DISCONTINUED] fluticasone (FLONASE) 50 MCG/ACT nasal spray SHAKE LIQUID AND USE 2 SPRAYS IN EACH NOSTRIL DAILY   [DISCONTINUED] metFORMIN (GLUCOPHAGE) 500 MG tablet Take 1  tablet (500 mg total) by mouth 2 (two) times daily with a meal.   [DISCONTINUED] rosuvastatin (CRESTOR) 10 MG tablet Take 1 tablet (10 mg total) by mouth daily.   [DISCONTINUED] telmisartan (MICARDIS) 20 MG tablet Take 1 tablet (20 mg total) by mouth daily.   [DISCONTINUED] famotidine (PEPCID) 20 MG tablet Take 1 tablet (20 mg total) by mouth 2 (two) times daily.   No facility-administered medications prior to visit.    Review of Systems  HENT:  Positive for ear pain and sinus pressure.   Eyes:  Positive for redness and itching.  Skin:  Positive for rash.    Objective    BP (!) 156/100 (BP Location: Right Arm, Patient Position: Sitting, Cuff Size:  Normal)   Pulse 98   Temp 98.3 F (36.8 C) (Oral)   Resp 14   Ht  (1.6 m)   Wt 165 lb 1.6 oz (74.9 kg)   SpO2 100%   BMI 29.25 kg/m   Physical Exam Vitals and nursing note reviewed.  Constitutional:      General: She is awake. She is not in acute distress.    Appearance: Normal appearance. She is well-developed, well-groomed and overweight. She is not ill-appearing, toxic-appearing or diaphoretic.  HENT:     Head: Normocephalic and atraumatic.     Jaw: There is normal jaw occlusion. No trismus, tenderness, swelling or pain on movement.     Right Ear: Hearing, tympanic membrane, ear canal and external ear normal. There is no impacted cerumen.     Left Ear: Hearing, tympanic membrane, ear canal and external ear normal. There is no impacted cerumen.     Nose: Nose normal. No congestion or rhinorrhea.     Right Turbinates: Not enlarged, swollen or pale.     Left Turbinates: Not enlarged, swollen or pale.     Right Sinus: No maxillary sinus tenderness or frontal sinus tenderness.     Left Sinus: No maxillary sinus tenderness or frontal sinus tenderness.     Mouth/Throat:     Lips: Pink.     Mouth: Mucous membranes are moist. No injury.     Tongue: No lesions.     Pharynx: Oropharynx is clear. Uvula midline. No pharyngeal swelling, oropharyngeal exudate, posterior oropharyngeal erythema or uvula swelling.     Tonsils: No tonsillar exudate or tonsillar abscesses.  Eyes:     General: Lids are normal. Lids are everted, no foreign bodies appreciated. Vision grossly intact. Gaze aligned appropriately. No allergic shiner or visual field deficit.       Right eye: No discharge.        Left eye: No discharge.     Extraocular Movements: Extraocular movements intact.     Conjunctiva/sclera: Conjunctivae normal.     Right eye: Right conjunctiva is not injected. No exudate.    Left eye: Left conjunctiva is not injected. No exudate.    Pupils: Pupils are equal, round, and reactive to light.   Neck:     Thyroid: Thyromegaly present. No thyroid mass or thyroid tenderness.     Vascular: No carotid bruit.     Trachea: Trachea normal.   Cardiovascular:     Rate and Rhythm: Normal rate and regular rhythm.     Pulses: Normal pulses.          Carotid pulses are 2+ on the right side and 2+ on the left side.      Radial pulses are 2+ on the right side and 2+ on the  left side.       Dorsalis pedis pulses are 2+ on the right side and 2+ on the left side.       Posterior tibial pulses are 2+ on the right side and 2+ on the left side.     Heart sounds: Normal heart sounds, S1 normal and S2 normal. No murmur heard.    No friction rub. No gallop.  Pulmonary:     Effort: Pulmonary effort is normal. No respiratory distress.     Breath sounds: Normal breath sounds and air entry. No stridor. No wheezing, rhonchi or rales.  Chest:     Chest wall: No tenderness.  Abdominal:     General: Abdomen is flat. Bowel sounds are normal. There is no distension.     Palpations: Abdomen is soft. There is no mass.     Tenderness: There is no abdominal tenderness. There is no right CVA tenderness, left CVA tenderness, guarding or rebound.     Hernia: No hernia is present.  Genitourinary:    Comments: Exam deferred; denies complaints Musculoskeletal:        General: No swelling, tenderness, deformity or signs of injury. Normal range of motion.     Cervical back: Full passive range of motion without pain, normal range of motion and neck supple. No edema, rigidity or tenderness. No muscular tenderness.     Right lower leg: No edema.     Left lower leg: No edema.  Lymphadenopathy:     Cervical: No cervical adenopathy.     Right cervical: No superficial, deep or posterior cervical adenopathy.    Left cervical: No superficial, deep or posterior cervical adenopathy.  Skin:    General: Skin is warm and dry.     Capillary Refill: Capillary refill takes less than 2 seconds.     Coloration: Skin is not  jaundiced or pale.     Findings: No bruising, erythema, lesion or rash.  Neurological:     General: No focal deficit present.     Mental Status: She is alert and oriented to person, place, and time. Mental status is at baseline.     GCS: GCS eye subscore is 4. GCS verbal subscore is 5. GCS motor subscore is 6.     Sensory: Sensation is intact. No sensory deficit.     Motor: Motor function is intact. No weakness.     Coordination: Coordination is intact. Coordination normal.     Gait: Gait is intact. Gait normal.  Psychiatric:        Attention and Perception: Attention and perception normal.        Mood and Affect: Mood and affect normal.        Speech: Speech normal.        Behavior: Behavior normal. Behavior is cooperative.        Thought Content: Thought content normal.        Cognition and Memory: Cognition and memory normal.        Judgment: Judgment normal.     Last depression screening scores    08/23/2022   11:06 AM 07/28/2021    8:48 AM 03/17/2021    8:56 AM  PHQ 2/9 Scores  PHQ - 2 Score 0 0 0  PHQ- 9 Score 4 0 0   Last fall risk screening    08/23/2022   11:06 AM  Fall Risk   Falls in the past year? 0  Number falls in past yr: 0  Injury with Fall? 0  Risk for fall due to : No Fall Risks  Follow up Falls evaluation completed   Last Audit-C alcohol use screening    08/23/2022   11:07 AM  Alcohol Use Disorder Test (AUDIT)  1. How often do you have a drink containing alcohol? 0  2. How many drinks containing alcohol do you have on a typical day when you are drinking? 0  3. How often do you have six or more drinks on one occasion? 0  AUDIT-C Score 0   A score of 3 or more in women, and 4 or more in men indicates increased risk for alcohol abuse, EXCEPT if all of the points are from question 1   Results for orders placed or performed in visit on 08/23/22  POCT HgB A1C  Result Value Ref Range   Hemoglobin A1C 6.7 (A) 4.0 - 5.6 %   HbA1c POC (<> result, manual  entry)     HbA1c, POC (prediabetic range)     HbA1c, POC (controlled diabetic range)      Assessment & Plan    Routine Health Maintenance and Physical Exam  Exercise Activities and Dietary recommendations  Goals   None     Immunization History  Administered Date(s) Administered   Influenza,inj,Quad PF,6+ Mos 03/17/2021   PFIZER(Purple Top)SARS-COV-2 Vaccination 10/18/2019, 11/29/2019    Health Maintenance  Topic Date Due   OPHTHALMOLOGY EXAM  Never done   DTaP/Tdap/Td (1 - Tdap) Never done   Zoster Vaccines- Shingrix (1 of 2) Never done   Diabetic kidney evaluation - Urine ACR  11/18/2020   COVID-19 Vaccine (3 - 2023-24 season) 01/07/2022   FOOT EXAM  07/29/2022   INFLUENZA VACCINE  12/08/2022   HEMOGLOBIN A1C  02/22/2023   Diabetic kidney evaluation - eGFR measurement  03/23/2023   MAMMOGRAM  09/03/2023   COLONOSCOPY (Pts 45-75yrs Insurance coverage will need to be confirmed)  03/02/2030   Hepatitis C Screening  Completed   HIV Screening  Completed   HPV VACCINES  Aged Out    Discussed health benefits of physical activity, and encouraged her to engage in regular exercise appropriate for her age and condition.  Problem List Items Addressed This Visit       Cardiovascular and Mediastinum   Hypertension associated with diabetes    Chronic, remains elevated Reports home Bps 140s/90s Will increase from 20 to 40 mg today with addition of hctz 12.5 Goal <129/<79 One month f/u recommended       Relevant Medications   telmisartan-hydrochlorothiazide (MICARDIS HCT) 40-12.5 MG tablet   Other Relevant Orders   CBC with Differential/Platelet   Comprehensive Metabolic Panel (CMET)     Endocrine   Hyperlipidemia associated with type 2 diabetes mellitus    Chronic, previously elevated Endorses poor compliance with statin in setting of myalgia  Repeat LP Goal of <70 LDL       Relevant Medications   telmisartan-hydrochlorothiazide (MICARDIS HCT) 40-12.5 MG tablet    Other Relevant Orders   Lipid panel   Type 2 diabetes mellitus with diabetic dermatitis, without long-term current use of insulin    Worsening dermatitis on bilateral forearms; no concern for infection Continue to monitor  A1c remains stable off metformin Continue to recommend balanced, lower carb meals. Smaller meal size, adding snacks. Choosing water as drink of choice and increasing purposeful exercise. Repeat urine micro       Relevant Medications   telmisartan-hydrochlorothiazide (MICARDIS HCT) 40-12.5 MG tablet   Other Relevant Orders   POCT HgB  A1C (Completed)   CBC with Differential/Platelet   Comprehensive Metabolic Panel (CMET)   Urine Microalbumin w/creat. ratio     Musculoskeletal and Integument   Statin myopathy    Continue to monitor LP; goal of LDL <70 Previously on crestor; may benefit from further statin restart and trial of zetia to assess for similar complaints         Other   Annual physical exam - Primary    Plans to have DM eye exam soon Reports UTD on dental Declines foot exam today as well as vaccines Things to do to keep yourself healthy  - Exercise at least 30-45 minutes a day, 3-4 days a week.  - Eat a low-fat diet with lots of fruits and vegetables, up to 7-9 servings per day.  - Seatbelts can save your life. Wear them always.  - Smoke detectors on every level of your home, check batteries every year.  - Eye Doctor - have an eye exam every 1-2 years  - Safe sex - if you may be exposed to STDs, use a condom.  - Alcohol -  If you drink, do it moderately, less than 2 drinks per day.  - Health Care Power of Attorney. Choose someone to speak for you if you are not able.  - Depression is common in our stressful world.If you're feeling down or losing interest in things you normally enjoy, please come in for a visit.  - Violence - If anyone is threatening or hurting you, please call immediately.       Relevant Orders   CBC with Differential/Platelet    Comprehensive Metabolic Panel (CMET)   TSH + free T4   Urine Microalbumin w/creat. ratio   Lipid panel   H/O partial thyroidectomy    Historical; slight fullness on R middle of thyroid Patient with elevated BP; stable HR without complaints of anxiety, insomnia, etc Repeat labs; patient wishes to defer [place] at this time and will wait for labs to return       Relevant Orders   TSH + free T4   Screening mammogram for breast cancer    Due for screening for mammogram, denies breast concerns, provided with phone number to call and schedule appointment for mammogram. Encouraged to repeat breast cancer screening every 1-2 years.       Relevant Orders   MM 3D SCREENING MAMMOGRAM BILATERAL BREAST   Return in about 4 weeks (around 09/20/2022) for blood pressure check.    Leilani Merl, FNP, have reviewed all documentation for this visit. The documentation on 08/23/22 for the exam, diagnosis, procedures, and orders are all accurate and complete.  [name], FNP  [facility] [phone] (phone) [phone] (fax)  [facility]

## 2022-08-23 ENCOUNTER — Ambulatory Visit: Payer: Managed Care, Other (non HMO) | Admitting: Family Medicine

## 2022-08-23 ENCOUNTER — Encounter: Payer: Self-pay | Admitting: Family Medicine

## 2022-08-23 VITALS — BP 156/100 | HR 98 | Temp 98.3°F | Resp 14 | Ht 63.0 in | Wt 165.1 lb

## 2022-08-23 DIAGNOSIS — E890 Postprocedural hypothyroidism: Secondary | ICD-10-CM | POA: Diagnosis not present

## 2022-08-23 DIAGNOSIS — E785 Hyperlipidemia, unspecified: Secondary | ICD-10-CM

## 2022-08-23 DIAGNOSIS — I152 Hypertension secondary to endocrine disorders: Secondary | ICD-10-CM

## 2022-08-23 DIAGNOSIS — G720 Drug-induced myopathy: Secondary | ICD-10-CM

## 2022-08-23 DIAGNOSIS — Z1231 Encounter for screening mammogram for malignant neoplasm of breast: Secondary | ICD-10-CM | POA: Insufficient documentation

## 2022-08-23 DIAGNOSIS — Z0000 Encounter for general adult medical examination without abnormal findings: Secondary | ICD-10-CM

## 2022-08-23 DIAGNOSIS — E1169 Type 2 diabetes mellitus with other specified complication: Secondary | ICD-10-CM | POA: Diagnosis not present

## 2022-08-23 DIAGNOSIS — E1159 Type 2 diabetes mellitus with other circulatory complications: Secondary | ICD-10-CM | POA: Diagnosis not present

## 2022-08-23 DIAGNOSIS — E11620 Type 2 diabetes mellitus with diabetic dermatitis: Secondary | ICD-10-CM

## 2022-08-23 LAB — POCT GLYCOSYLATED HEMOGLOBIN (HGB A1C): Hemoglobin A1C: 6.7 % — AB (ref 4.0–5.6)

## 2022-08-23 MED ORDER — TELMISARTAN-HCTZ 40-12.5 MG PO TABS: 1.0000 | ORAL_TABLET | Freq: Every day | ORAL | 3 refills | Status: DC

## 2022-08-23 NOTE — Assessment & Plan Note (Signed)
Worsening dermatitis on bilateral forearms; no concern for infection Continue to monitor  A1c remains stable off metformin Continue to recommend balanced, lower carb meals. Smaller meal size, adding snacks. Choosing water as drink of choice and increasing purposeful exercise. Repeat urine micro

## 2022-08-23 NOTE — Assessment & Plan Note (Signed)
Continue to monitor LP; goal of LDL <70 Previously on crestor; may benefit from further statin restart and trial of zetia to assess for similar complaints

## 2022-08-23 NOTE — Assessment & Plan Note (Signed)
Historical; slight fullness on R middle of thyroid Patient with elevated BP; stable HR without complaints of anxiety, insomnia, etc Repeat labs; patient wishes to defer [place] at this time and will wait for labs to return

## 2022-08-23 NOTE — Assessment & Plan Note (Signed)
Due for screening for mammogram, denies breast concerns, provided with phone number to call and schedule appointment for mammogram. Encouraged to repeat breast cancer screening every 1-2 years.  

## 2022-08-23 NOTE — Assessment & Plan Note (Signed)
Chronic, remains elevated Reports home Bps 140s/90s Will increase from 20 to 40 mg today with addition of hctz 12.5 Goal <129/<79 One month f/u recommended

## 2022-08-23 NOTE — Assessment & Plan Note (Signed)
Chronic, previously elevated Endorses poor compliance with statin in setting of myalgia  Repeat LP Goal of <70 LDL

## 2022-08-23 NOTE — Patient Instructions (Addendum)
Change from telmisartan 20 to 40-12.5 to help with meeting bp goal of 129/79 or less  The CDC recommends two doses of Shingrix (the new shingles vaccine) separated by 2 to 6 months for adults age 58 years and older. I recommend checking with your insurance plan regarding coverage for this vaccine.

## 2022-08-23 NOTE — Assessment & Plan Note (Signed)
Plans to have DM eye exam soon Reports UTD on dental Declines foot exam today as well as vaccines Things to do to keep yourself healthy  - Exercise at least 30-45 minutes a day, 3-4 days a week.  - Eat a low-fat diet with lots of fruits and vegetables, up to 7-9 servings per day.  - Seatbelts can save your life. Wear them always.  - Smoke detectors on every level of your home, check batteries every year.  - Eye Doctor - have an eye exam every 1-2 years  - Safe sex - if you may be exposed to STDs, use a condom.  - Alcohol -  If you drink, do it moderately, less than 2 drinks per day.  - Health Care Power of Attorney. Choose someone to speak for you if you are not able.  - Depression is common in our stressful world.If you're feeling down or losing interest in things you normally enjoy, please come in for a visit.  - Violence - If anyone is threatening or hurting you, please call immediately.

## 2022-08-24 ENCOUNTER — Other Ambulatory Visit: Payer: Self-pay | Admitting: Family Medicine

## 2022-08-24 MED ORDER — ROSUVASTATIN CALCIUM 40 MG PO TABS: 40.0000 mg | ORAL_TABLET | Freq: Every day | ORAL | 3 refills | Status: DC

## 2022-08-24 NOTE — Progress Notes (Signed)
Labs stable with exception of cholesterol which remains elevated. Stroke and heart attack risk of 30% in 10 years. Recommend restart of statin to assist in meeting LDL goal of <70.

## 2022-08-25 LAB — COMPREHENSIVE METABOLIC PANEL
ALT: 29 IU/L (ref 0–32)
AST: 24 IU/L (ref 0–40)
Albumin/Globulin Ratio: 1.3 (ref 1.2–2.2)
Albumin: 4.7 g/dL (ref 3.8–4.9)
Alkaline Phosphatase: 119 IU/L (ref 44–121)
BUN/Creatinine Ratio: 12 (ref 9–23)
BUN: 10 mg/dL (ref 6–24)
Bilirubin Total: 0.3 mg/dL (ref 0.0–1.2)
CO2: 23 mmol/L (ref 20–29)
Calcium: 9.8 mg/dL (ref 8.7–10.2)
Chloride: 101 mmol/L (ref 96–106)
Creatinine, Ser: 0.81 mg/dL (ref 0.57–1.00)
Globulin, Total: 3.6 g/dL (ref 1.5–4.5)
Glucose: 129 mg/dL — ABNORMAL HIGH (ref 70–99)
Potassium: 4.7 mmol/L (ref 3.5–5.2)
Sodium: 139 mmol/L (ref 134–144)
Total Protein: 8.3 g/dL (ref 6.0–8.5)
eGFR: 84 mL/min/{1.73_m2} (ref >59)

## 2022-08-25 LAB — CBC WITH DIFFERENTIAL/PLATELET
Basophils Absolute: 0.1 10*3/uL (ref 0.0–0.2)
Basos: 1 %
EOS (ABSOLUTE): 0.1 10*3/uL (ref 0.0–0.4)
Eos: 2 %
Hematocrit: 40.5 % (ref 34.0–46.6)
Hemoglobin: 13.2 g/dL (ref 11.1–15.9)
Immature Grans (Abs): 0 10*3/uL (ref 0.0–0.1)
Immature Granulocytes: 0 %
Lymphocytes Absolute: 3.1 10*3/uL (ref 0.7–3.1)
Lymphs: 52 %
MCH: 24.7 pg — ABNORMAL LOW (ref 26.6–33.0)
MCHC: 32.6 g/dL (ref 31.5–35.7)
MCV: 76 fL — ABNORMAL LOW (ref 79–97)
Monocytes Absolute: 0.5 10*3/uL (ref 0.1–0.9)
Monocytes: 8 %
Neutrophils Absolute: 2.3 10*3/uL (ref 1.4–7.0)
Neutrophils: 37 %
Platelets: 326 10*3/uL (ref 150–450)
RBC: 5.34 x10E6/uL — ABNORMAL HIGH (ref 3.77–5.28)
RDW: 14 % (ref 11.7–15.4)
WBC: 6 10*3/uL (ref 3.4–10.8)

## 2022-08-25 LAB — LIPID PANEL
Chol/HDL Ratio: 4.8 ratio — ABNORMAL HIGH (ref 0.0–4.4)
Cholesterol, Total: 208 mg/dL — ABNORMAL HIGH (ref 100–199)
HDL: 43 mg/dL (ref >39)
LDL Chol Calc (NIH): 117 mg/dL — ABNORMAL HIGH (ref 0–99)
Triglycerides: 276 mg/dL — ABNORMAL HIGH (ref 0–149)
VLDL Cholesterol Cal: 48 mg/dL — ABNORMAL HIGH (ref 5–40)

## 2022-08-25 LAB — TSH+FREE T4
Free T4: 1.22 ng/dL (ref 0.82–1.77)
TSH: 1.88 u[IU]/mL (ref 0.450–4.500)

## 2022-08-25 LAB — MICROALBUMIN / CREATININE URINE RATIO
Creatinine, Urine: 53.4 mg/dL
Microalb/Creat Ratio: 7 mg/g creat (ref 0–29)
Microalbumin, Urine: 3.6 ug/mL

## 2022-09-22 ENCOUNTER — Ambulatory Visit: Payer: Managed Care, Other (non HMO) | Admitting: Family Medicine

## 2022-10-05 ENCOUNTER — Ambulatory Visit: Payer: Managed Care, Other (non HMO) | Admitting: Family Medicine

## 2022-12-07 ENCOUNTER — Ambulatory Visit (INDEPENDENT_AMBULATORY_CARE_PROVIDER_SITE_OTHER): Payer: Managed Care, Other (non HMO) | Admitting: Family Medicine

## 2022-12-07 ENCOUNTER — Encounter: Payer: Self-pay | Admitting: Family Medicine

## 2022-12-07 VITALS — BP 128/86 | HR 97 | Temp 98.0°F | Resp 12 | Ht 63.0 in | Wt 167.3 lb

## 2022-12-07 DIAGNOSIS — F39 Unspecified mood [affective] disorder: Secondary | ICD-10-CM | POA: Insufficient documentation

## 2022-12-07 DIAGNOSIS — I152 Hypertension secondary to endocrine disorders: Secondary | ICD-10-CM

## 2022-12-07 DIAGNOSIS — E1159 Type 2 diabetes mellitus with other circulatory complications: Secondary | ICD-10-CM

## 2022-12-07 DIAGNOSIS — H66001 Acute suppurative otitis media without spontaneous rupture of ear drum, right ear: Secondary | ICD-10-CM

## 2022-12-07 MED ORDER — AMOXICILLIN-POT CLAVULANATE 875-125 MG PO TABS: 1.0000 | ORAL_TABLET | Freq: Two times a day (BID) | ORAL | 0 refills | Status: DC

## 2022-12-07 MED ORDER — BUPROPION HCL ER (XL) 150 MG PO TB24: 150.0000 mg | ORAL_TABLET | Freq: Every day | ORAL | 1 refills | Status: DC

## 2022-12-07 MED ORDER — TELMISARTAN-HCTZ 40-12.5 MG PO TABS: 1.0000 | ORAL_TABLET | Freq: Every day | ORAL | 3 refills | Status: DC

## 2022-12-07 NOTE — Assessment & Plan Note (Signed)
Reports stressors iso of school, work and move Trial of wellbutrin to assist with symptoms and f/u in 6-8 weeks May assist with BMI as well Body mass index is 29.64 kg/m. Continue to recommend balanced, lower carb meals. Smaller meal size, adding snacks. Choosing water as drink of choice and increasing purposeful exercise.

## 2022-12-07 NOTE — Assessment & Plan Note (Signed)
Chronic, stable Continue Micardis 40-12.5 At goal; continue to monitor

## 2022-12-07 NOTE — Progress Notes (Signed)
Established patient visit   Patient: Pamela Chapman   DOB: [birth year]   57 y.o. Female  MRN: [id number] Visit Date: 12/07/2022  Today's healthcare provider: [name], FNP  Introduced to nurse practitioner role and practice setting.  All questions answered.  Discussed provider/patient relationship and expectations.  Subjective    HPI HPI     Medical Management of Chronic Issues    Additional comments: Patient reports checking BP at home, she reports stable. She does not check sugars at home. She reports she just had her wellness check with work. And they checked A1c. She does not have results.       Last edited by Myles Lipps, CMA on 12/07/2022 10:55 AM.      Medications: Outpatient Medications Prior to Visit  Medication Sig   rosuvastatin (CRESTOR) 40 MG tablet Take 1 tablet (40 mg total) by mouth daily.   [DISCONTINUED] telmisartan-hydrochlorothiazide (MICARDIS HCT) 40-12.5 MG tablet Take 1 tablet by mouth daily.   No facility-administered medications prior to visit.      Objective    BP 128/86 (BP Location: Left Arm, Cuff Size: Large)   Pulse 97   Temp 98 F (36.7 C) (Temporal)   Resp 12   Ht 5\' 3"  (1.6 m)   Wt 167 lb 4.8 oz (75.9 kg)   SpO2 100%   BMI 29.64 kg/m   Physical Exam Vitals and nursing note reviewed.  Constitutional:      General: She is not in acute distress.    Appearance: Normal appearance. She is overweight. She is not ill-appearing, toxic-appearing or diaphoretic.  HENT:     Head: Normocephalic and atraumatic.     Right Ear: Swelling and tenderness present. A middle ear effusion is present.     Left Ear: Tympanic membrane, ear canal and external ear normal.     Nose: Nose normal.     Mouth/Throat:     Mouth: Mucous membranes are moist.     Pharynx: Oropharynx is clear.  Eyes:     Extraocular Movements: Extraocular movements intact.     Conjunctiva/sclera: Conjunctivae normal.  Cardiovascular:     Rate and  Rhythm: Normal rate and regular rhythm.     Pulses: Normal pulses.     Heart sounds: Normal heart sounds. No murmur heard.    No friction rub. No gallop.  Pulmonary:     Effort: Pulmonary effort is normal. No respiratory distress.     Breath sounds: Normal breath sounds. No stridor. No wheezing, rhonchi or rales.  Chest:     Chest wall: No tenderness.  Musculoskeletal:        General: No swelling, tenderness, deformity or signs of injury. Normal range of motion.     Right lower leg: No edema.     Left lower leg: No edema.  Skin:    General: Skin is warm and dry.     Capillary Refill: Capillary refill takes less than 2 seconds.     Coloration: Skin is not jaundiced or pale.     Findings: No bruising, erythema, lesion or rash.  Neurological:     General: No focal deficit present.     Mental Status: She is alert and oriented to person, place, and time. Mental status is at baseline.     Cranial Nerves: No cranial nerve deficit.     Sensory: No sensory deficit.     Motor: No weakness.     Coordination: Coordination normal.  Psychiatric:  Mood and Affect: Mood normal.        Behavior: Behavior normal.        Thought Content: Thought content normal.        Judgment: Judgment normal.     No results found for any visits on 12/07/22.  Assessment & Plan     Problem List Items Addressed This Visit       Cardiovascular and Mediastinum   Hypertension associated with diabetes (HCC)    Chronic, stable Continue Micardis 40-12.5 At goal; continue to monitor       Relevant Medications   telmisartan-hydrochlorothiazide (MICARDIS HCT) 40-12.5 MG tablet     Nervous and Auditory   Non-recurrent acute suppurative otitis media of right ear without spontaneous rupture of tympanic membrane - Primary    Acute, stable F/u as needed Recommend ABX given concern for fluid back up and ongoing pain      Relevant Medications   amoxicillin-clavulanate (AUGMENTIN) 875-125 MG tablet      Other   Mood disorder (HCC)    Reports stressors iso of school, work and move Trial of wellbutrin to assist with symptoms and f/u in 6-8 weeks May assist with BMI as well Body mass index is 29.64 kg/m. Continue to recommend balanced, lower carb meals. Smaller meal size, adding snacks. Choosing water as drink of choice and increasing purposeful exercise.       Relevant Medications   buPROPion (WELLBUTRIN XL) 150 MG 24 hr tablet   Return in about 6 weeks (around 01/18/2023) for anxiety and depression- OK for VV.     Leilani Merl, FNP, have reviewed all documentation for this visit. The documentation on 12/07/22 for the exam, diagnosis, procedures, and orders are all accurate and complete.  [name], FNP  [facility] [phone] (phone) [phone] (fax)  [facility]

## 2022-12-07 NOTE — Assessment & Plan Note (Signed)
Acute, stable F/u as needed Recommend ABX given concern for fluid back up and ongoing pain

## 2023-01-18 ENCOUNTER — Telehealth: Payer: Managed Care, Other (non HMO) | Admitting: Family Medicine

## 2023-01-18 ENCOUNTER — Encounter: Payer: Self-pay | Admitting: Family Medicine

## 2023-01-18 VITALS — BP 125/87 | HR 92

## 2023-01-18 DIAGNOSIS — E1159 Type 2 diabetes mellitus with other circulatory complications: Secondary | ICD-10-CM | POA: Diagnosis not present

## 2023-01-18 DIAGNOSIS — F39 Unspecified mood [affective] disorder: Secondary | ICD-10-CM

## 2023-01-18 DIAGNOSIS — I152 Hypertension secondary to endocrine disorders: Secondary | ICD-10-CM | POA: Diagnosis not present

## 2023-01-18 MED ORDER — BUPROPION HCL ER (XL) 300 MG PO TB24: 300.0000 mg | ORAL_TABLET | Freq: Every day | ORAL | 0 refills | Status: DC

## 2023-01-18 NOTE — Assessment & Plan Note (Signed)
Chronic, improved Continue to monitor at home if able Goal of 129/79; however, patient has just gotten off work this morning and works overnight which could be providing a false elevation Defer medication changes at this time; continue on telmisartan hydrochlorothiazide at 40-12.5

## 2023-01-18 NOTE — Progress Notes (Signed)
MyChart Video Visit  Virtual Visit via Video Note   This format is felt to be most appropriate for this patient at this time. Physical exam was limited by quality of the video and audio technology used for the visit.   Patient location: Home Provider location:  [facility] [address]  [address] [place], [place] [address]  I discussed the limitations of evaluation and management by telemedicine and the availability of in person appointments. The patient expressed understanding and agreed to proceed.  Patient: Pamela Chapman   DOB: 28-Feb-1966   57 y.o. Female  MRN: [id number] Visit Date: 01/18/2023  Today's healthcare provider: [name], FNP   No chief complaint on file.  Subjective    Medications: Outpatient Medications Prior to Visit  Medication Sig   amoxicillin-clavulanate (AUGMENTIN) 875-125 MG tablet Take 1 tablet by mouth 2 (two) times daily.   rosuvastatin (CRESTOR) 40 MG tablet Take 1 tablet (40 mg total) by mouth daily.   telmisartan-hydrochlorothiazide (MICARDIS HCT) 40-12.5 MG tablet Take 1 tablet by mouth daily.   [DISCONTINUED] buPROPion (WELLBUTRIN XL) 150 MG 24 hr tablet Take 1 tablet (150 mg total) by mouth daily.   No facility-administered medications prior to visit.    {See past labs  Heme  Chem  Endocrine  Serology    Objective    BP 125/87   Pulse 92   Physical Exam Constitutional:      Appearance: Normal appearance.  Pulmonary:     Effort: Pulmonary effort is normal.  Neurological:     General: No focal deficit present.     Mental Status: She is alert and oriented to person, place, and time. Mental status is at baseline.  Psychiatric:        Mood and Affect: Mood normal.        Behavior: Behavior normal.        Thought Content: Thought content normal.        Judgment: Judgment normal.       Assessment & Plan     Problem List Items Addressed This Visit       Cardiovascular and Mediastinum    Hypertension associated with diabetes (HCC) - Primary    Chronic, improved Continue to monitor at home if able Goal of 129/79; however, patient has just gotten off work this morning and works overnight which could be providing a false elevation Defer medication changes at this time; continue on telmisartan hydrochlorothiazide at 40-12.5        Other   Mood disorder (HCC)    Chronic, improved Will increase to 300 mg daily of wellbutrin; reach back out if symptoms are not improved further       Relevant Medications   buPROPion (WELLBUTRIN XL) 300 MG 24 hr tablet   Return if symptoms worsen or fail to improve.    I discussed the assessment and treatment plan with the patient. The patient was provided an opportunity to ask questions and all were answered. The patient agreed with the plan and demonstrated an understanding of the instructions.   The patient was advised to call back or seek an in-person evaluation if the symptoms worsen or if the condition fails to improve as anticipated.  I provided 10 minutes of face-to-face time during this encounter discussing mood concerns, and HTN.   Leilani Merl, FNP, have reviewed all documentation for this visit. The documentation on 01/18/23 for the exam, diagnosis, procedures, and orders are all accurate and complete.  [name], FNP [facility] [phone] (phone) [phone] (fax)  [facility]

## 2023-01-18 NOTE — Assessment & Plan Note (Signed)
Chronic, improved Will increase to 300 mg daily of wellbutrin; reach back out if symptoms are not improved further

## 2023-02-01 LAB — HM DIABETES EYE EXAM

## 2023-04-20 ENCOUNTER — Other Ambulatory Visit: Payer: Self-pay | Admitting: Family Medicine

## 2023-04-20 DIAGNOSIS — F39 Unspecified mood [affective] disorder: Secondary | ICD-10-CM

## 2023-04-20 NOTE — Telephone Encounter (Signed)
Please advise 

## 2023-06-01 ENCOUNTER — Telehealth: Payer: Self-pay | Admitting: Family Medicine

## 2023-06-01 DIAGNOSIS — F39 Unspecified mood [affective] disorder: Secondary | ICD-10-CM

## 2023-06-01 MED ORDER — BUPROPION HCL ER (XL) 300 MG PO TB24: ORAL_TABLET | ORAL | 0 refills | Status: DC

## 2023-06-01 NOTE — Telephone Encounter (Signed)
[facility] pharmacy faxed refill request for the following medications:  buPROPion (WELLBUTRIN XL) 300 MG 24 hr tablet    Please advise

## 2023-06-14 IMAGING — MG MM DIGITAL SCREENING BILAT W/ TOMO AND CAD
6 of 10 series · 6 of 30 positions shown · non-contrast
Comparison: Previous exam(s).

CLINICAL DATA: Screening.

EXAM:
DIGITAL SCREENING BILATERAL MAMMOGRAM WITH TOMOSYNTHESIS AND CAD
TECHNIQUE: Bilateral screening digital craniocaudal and mediolateral oblique
mammograms were obtained. Bilateral screening digital breast
tomosynthesis was performed. The images were evaluated with
computer-aided detection.

[R MLO synth-2D]
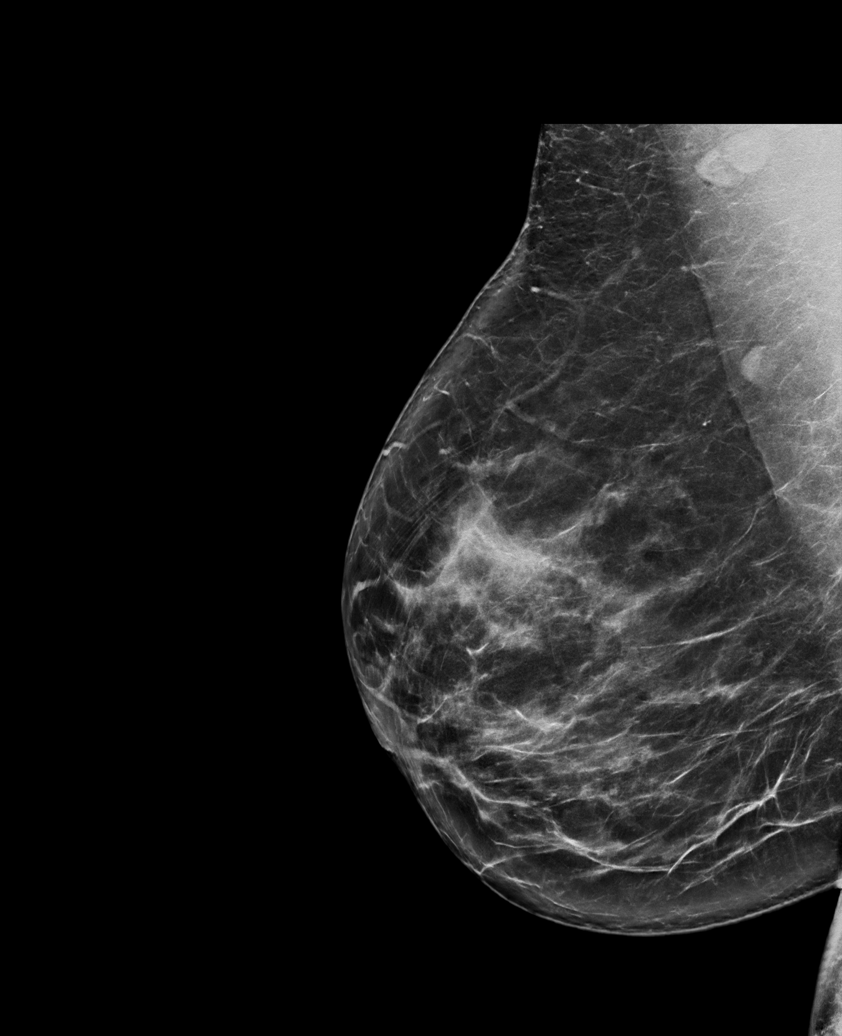

[L MLO synth-2D (1 of 2)]
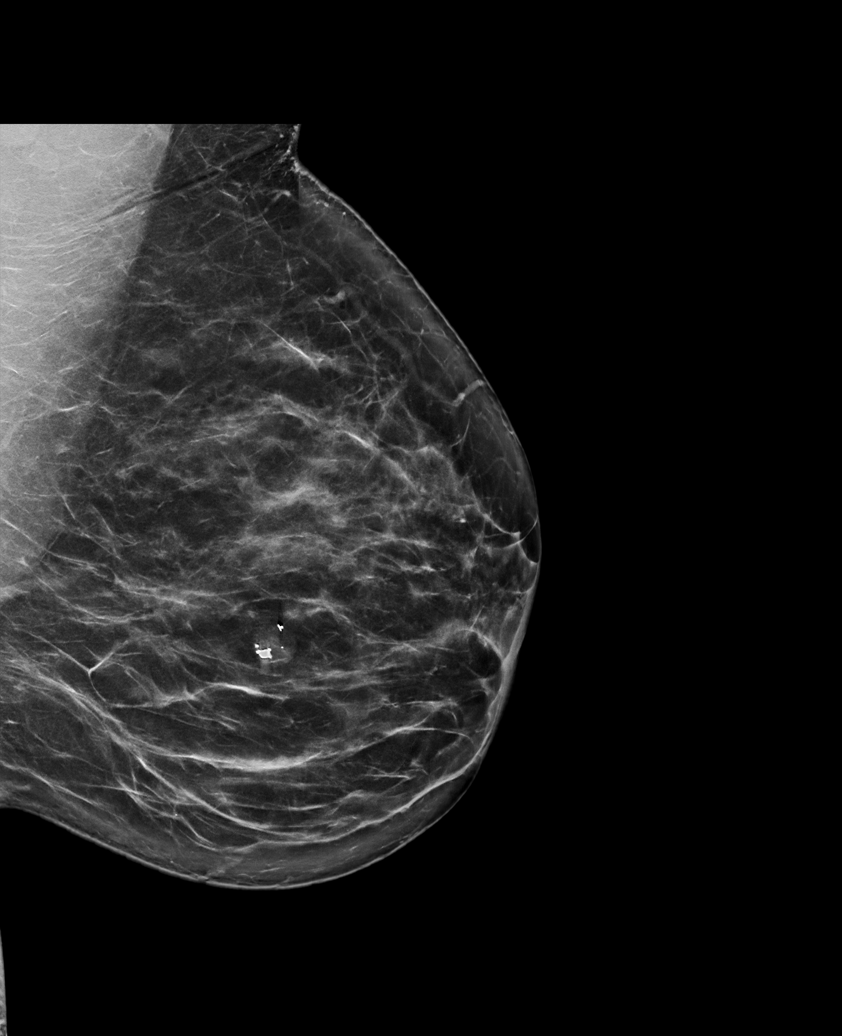

[L MLO synth-2D (2 of 2)]
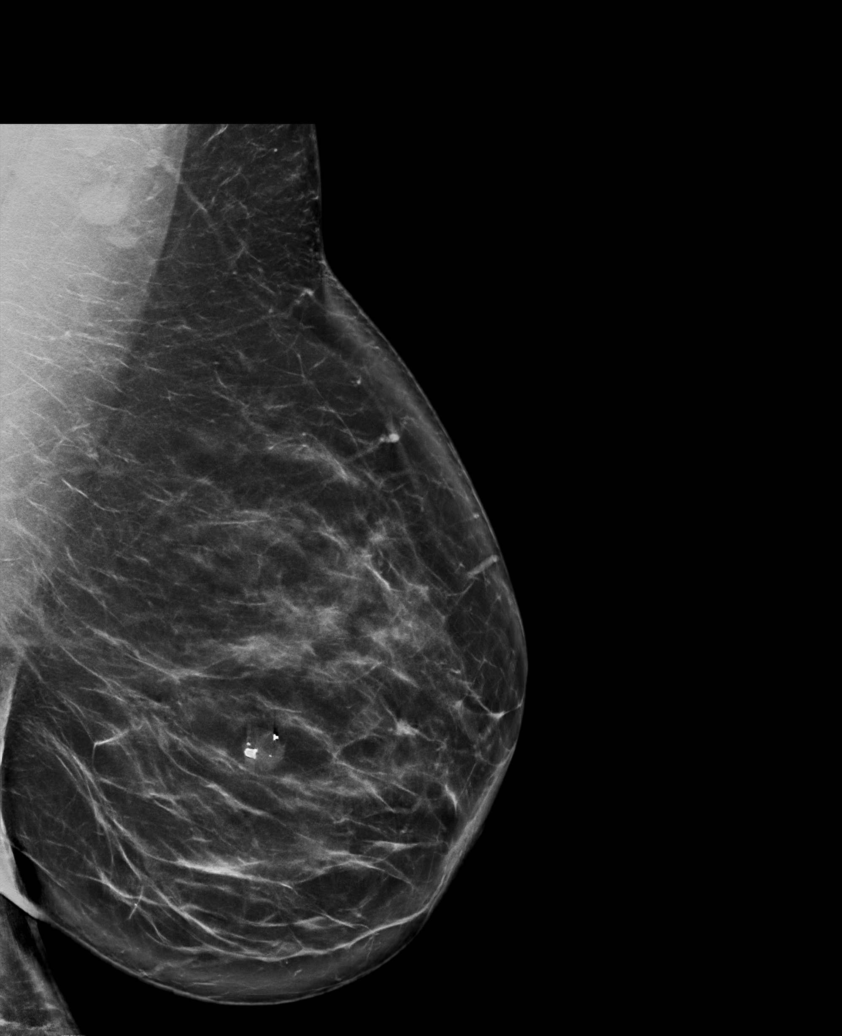

[L CC synth-2D]
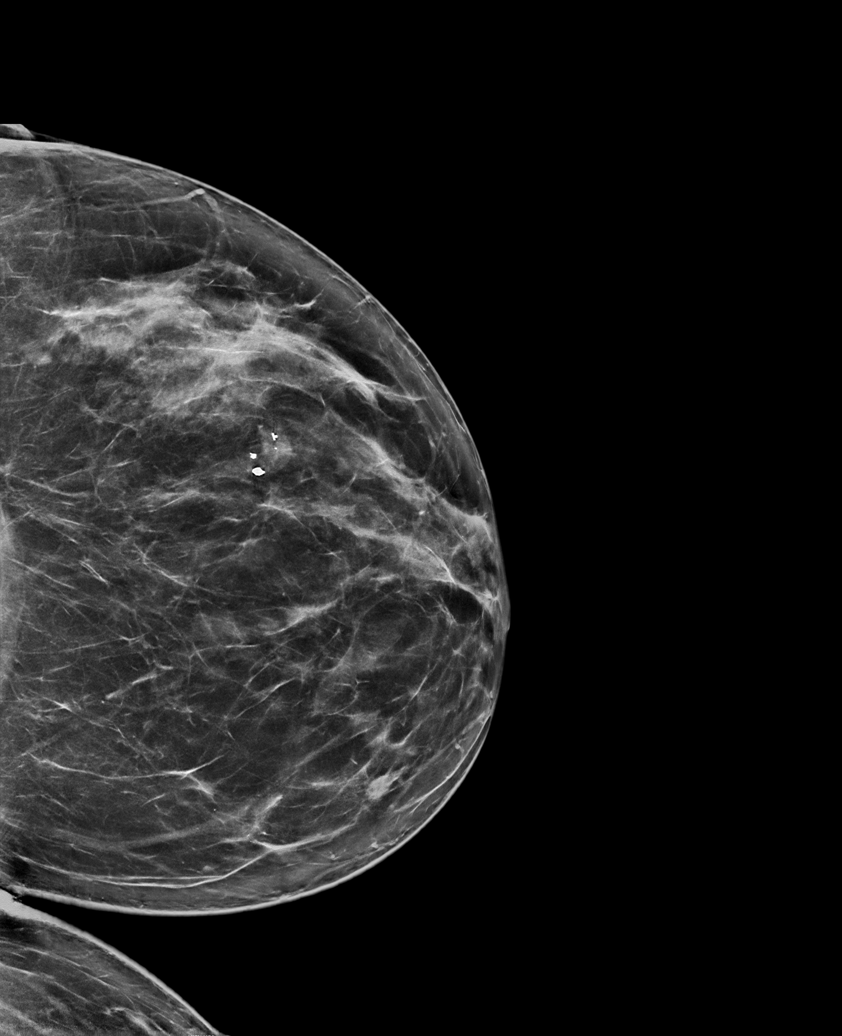

[R CC synth-2D]
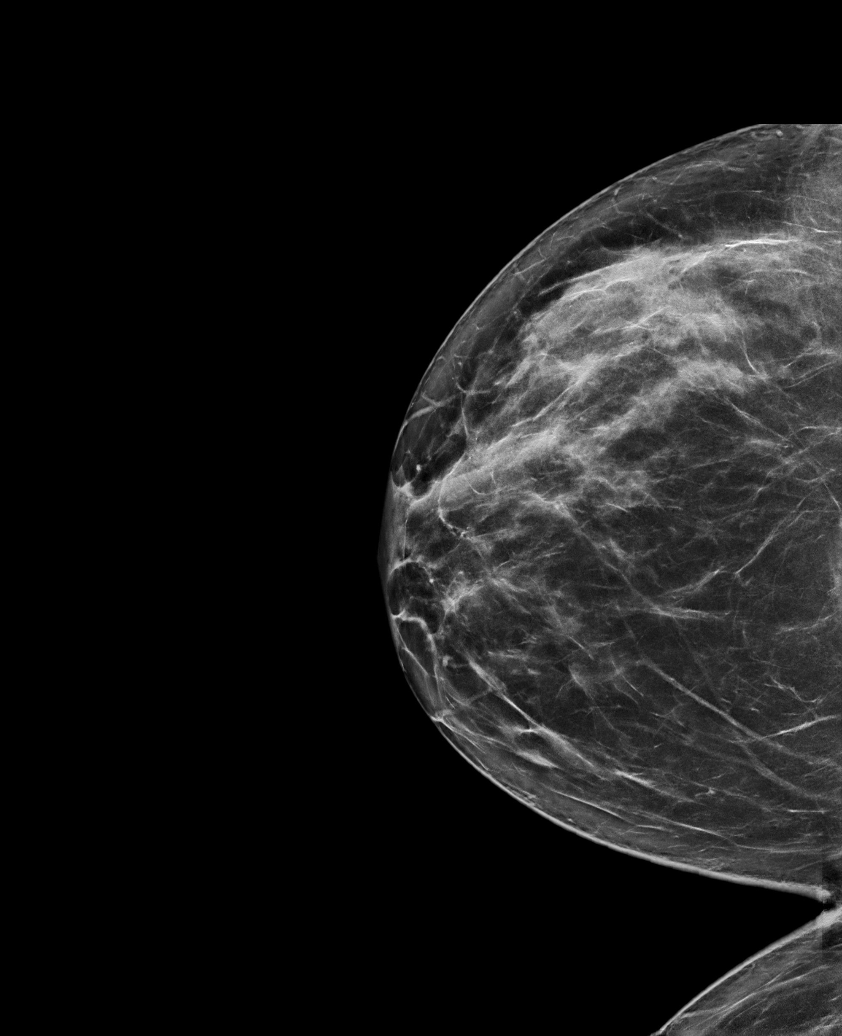

[R CC tomo · tomo slice 43/84.0]
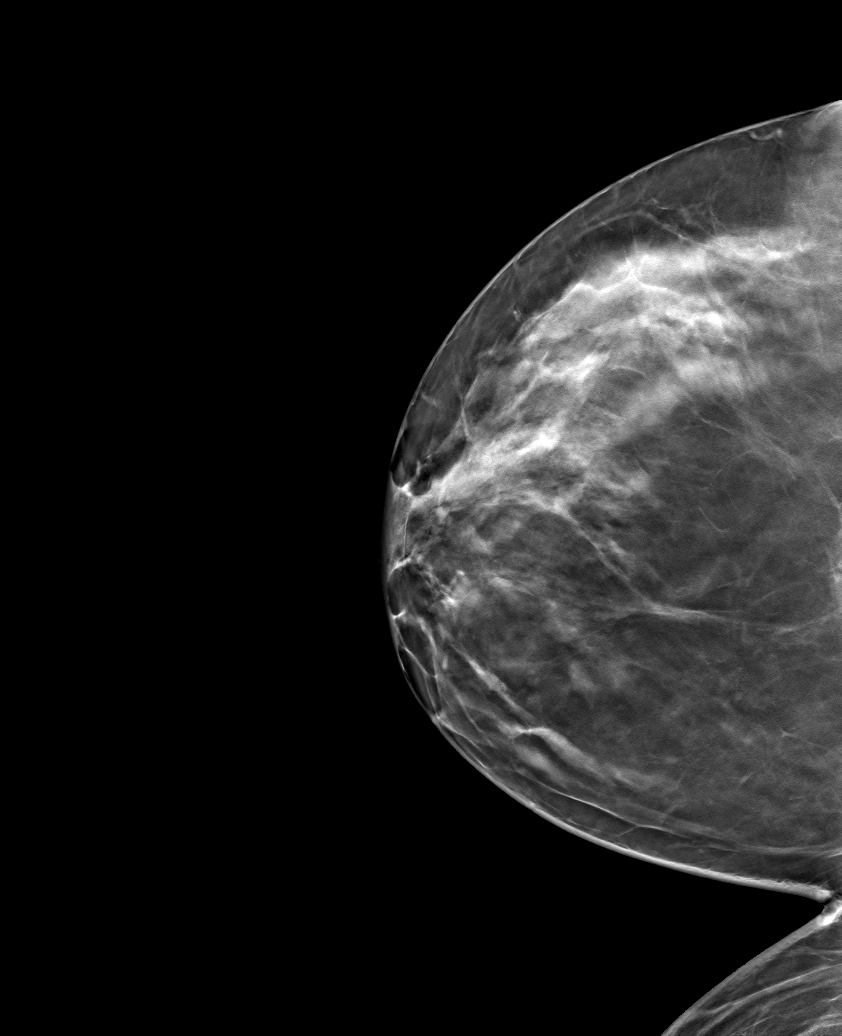

[6 of 30 positions shown; findings below may reference images not displayed]

ACR Breast Density Category c: The breast tissue is heterogeneously
dense, which may obscure small masses.
FINDINGS: There are no findings suspicious for malignancy.
IMPRESSION: No mammographic evidence of malignancy. A result letter of this
screening mammogram will be mailed directly to the patient.

RECOMMENDATION:
Screening mammogram in one year. (Code:Q3-W-BC3)

BI-RADS CATEGORY  1: Negative.

## 2023-07-04 ENCOUNTER — Telehealth: Payer: Self-pay

## 2023-07-04 ENCOUNTER — Telehealth: Payer: Self-pay | Admitting: Family Medicine

## 2023-07-04 NOTE — Telephone Encounter (Unsigned)
 Copied from CRM [id number]. Topic: Appointments - Appointment Scheduling >> Jul 04, 2023 10:31 AM Gery Pray wrote: Patient/patient representative is calling to schedule an appointment. Refer to attachments for appointment information. Patient calling to get an appointment scheduled for her labs. Please call patient back at [phone]

## 2023-07-04 NOTE — Telephone Encounter (Signed)
 Message sent with no details.

## 2023-07-04 NOTE — Telephone Encounter (Signed)
 Copied from CRM [id number]. Topic: Appointments - Appointment Scheduling >> Jul 04, 2023 10:31 AM Gery Pray wrote: Patient/patient representative is calling to schedule an appointment. Refer to attachments for appointment information. Patient calling to get an appointment scheduled for her labs. Please call patient back at [phone]

## 2023-07-05 NOTE — Telephone Encounter (Signed)
 Please clarify if patient will continue care at [facility] or will be transferring primary care

## 2023-07-11 ENCOUNTER — Encounter: Payer: Self-pay | Admitting: Family

## 2023-07-11 ENCOUNTER — Other Ambulatory Visit: Payer: Self-pay | Admitting: Family

## 2023-07-11 ENCOUNTER — Ambulatory Visit: Payer: BC Managed Care – PPO | Admitting: Family

## 2023-07-11 VITALS — BP 158/96 | HR 103 | Temp 97.9°F | Resp 16 | Ht 62.0 in | Wt 161.0 lb

## 2023-07-11 DIAGNOSIS — F39 Unspecified mood [affective] disorder: Secondary | ICD-10-CM | POA: Diagnosis not present

## 2023-07-11 DIAGNOSIS — Z23 Encounter for immunization: Secondary | ICD-10-CM | POA: Diagnosis not present

## 2023-07-11 DIAGNOSIS — Z7689 Persons encountering health services in other specified circumstances: Secondary | ICD-10-CM

## 2023-07-11 DIAGNOSIS — I10 Essential (primary) hypertension: Secondary | ICD-10-CM | POA: Diagnosis not present

## 2023-07-11 DIAGNOSIS — E1165 Type 2 diabetes mellitus with hyperglycemia: Secondary | ICD-10-CM

## 2023-07-11 DIAGNOSIS — Z131 Encounter for screening for diabetes mellitus: Secondary | ICD-10-CM

## 2023-07-11 LAB — POCT GLYCOSYLATED HEMOGLOBIN (HGB A1C): Hemoglobin A1C: 6.6 % — AB (ref 4.0–5.6)

## 2023-07-11 MED ORDER — METFORMIN HCL ER 500 MG PO TB24: 500.0000 mg | ORAL_TABLET | Freq: Every day | ORAL | 1 refills | Status: DC

## 2023-07-11 MED ORDER — VALSARTAN 40 MG PO TABS: 40.0000 mg | ORAL_TABLET | Freq: Every day | ORAL | 1 refills | Status: DC

## 2023-07-11 MED ORDER — BUPROPION HCL ER (XL) 300 MG PO TB24: ORAL_TABLET | ORAL | 0 refills | Status: DC

## 2023-07-11 NOTE — Progress Notes (Signed)
 Patient is here to established care with provider. ~health hx address ~care gaps address  Tdap given to patient today

## 2023-07-11 NOTE — Progress Notes (Signed)
 Subjective:    Pamela Chapman - 58 y.o. female MRN [id number]  Date of birth: [birth year]  HPI  UZMA Chapman is to establish care.   Current issues and/or concerns: - History of high blood pressure. States in the past she never took the medication prescribed to her for high blood pressure. She does not complain of red flag symptoms such as but not limited to chest pain, shortness of breath, worst headache of life, nausea/vomiting.  - States in the past she was told that she is "borderline diabetic".  - Doing well on Bupropion, no issues/concerns. She denies thoughts of self-harm, suicidal ideations, homicidal ideations. States Bupropion also helps with menopausal symptoms. - Right ear discomfort. Denies red flag symptoms.  - No further issues/concerns for discussion today.  ROS per HPI     Health Maintenance:  There are no preventive care reminders to display for this patient.   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 She has never used smokeless tobacco. She reports that she does not drink alcohol and does not use drugs.   Family History  family history includes Alzheimer's disease in her maternal grandmother; Arthritis in her mother; Bone cancer in her father; Breast cancer (age of onset: 64) in her maternal grandmother; Diabetes  in her maternal grandmother; Heart disease in her maternal grandfather; Hypertension in her father, maternal grandmother, and mother; Thyroid cancer in her father.   Medications: reviewed and updated   Objective:   Physical Exam BP (!) 158/96   Pulse (!) 103   Temp 97.9 F (36.6 C) (Oral)   Resp 16   Ht 5\' 2"  (1.575 m)   Wt 161 lb (73 kg)   SpO2 97%   BMI 29.45 kg/m   Physical Exam HENT:     Head: Normocephalic and atraumatic.     Right Ear: Tympanic membrane, ear canal and external ear normal.     Left Ear: Tympanic membrane, ear canal and external ear normal.     Nose: Nose normal.     Mouth/Throat:     Mouth: Mucous membranes are moist.     Pharynx: Oropharynx is clear.  Eyes:     Extraocular Movements: Extraocular movements intact.     Conjunctiva/sclera: Conjunctivae normal.     Pupils: Pupils are equal, round, and reactive to light.  Cardiovascular:     Rate and Rhythm: Tachycardia present.     Pulses: Normal pulses.     Heart sounds: Normal heart sounds.  Pulmonary:     Effort: Pulmonary effort is normal.     Breath sounds: Normal breath sounds.  Musculoskeletal:        General: Normal range of motion.     Cervical back: Normal range of motion and neck supple.  Neurological:     General: No focal deficit present.     Mental Status: She is alert and oriented to  person, place, and time.  Psychiatric:        Mood and Affect: Mood normal.        Behavior: Behavior normal.       Assessment & Plan:  1. Encounter to establish care (Primary) - Patient presents today to establish care. During the interim follow-up with primary provider as scheduled.  - Return for annual physical examination, labs, and health maintenance. Arrive fasting meaning having no food for at least 8 hours prior to appointment. You may have only water or black coffee. Please take scheduled medications as normal.  2. Primary hypertension - Blood pressure not at goal during today's visit.  Patient asymptomatic without chest pressure, chest pain, palpitations, shortness of breath, worst headache of life, and any additional red flag symptoms. - Begin Valsartan as prescribed.  - Routine screening.  - Counseled on blood pressure goal of less than 130/80, low-sodium, DASH diet, medication compliance, and 150 minutes of moderate intensity exercise per week as tolerated. Counseled on medication adherence and adverse effects. - Follow-up with primary provider in 4 weeks or sooner if needed. - valsartan (DIOVAN) 40 MG tablet; Take 1 tablet (40 mg total) by mouth daily.  Dispense: 30 tablet; Refill: 1 - Basic Metabolic Panel  3. Diabetes mellitus screening - Routine screening.  - POCT glycosylated hemoglobin (Hb A1C)  4. Mood disorder (HCC) - Patient denies thoughts of self-harm, suicidal ideations, homicidal ideations. - Continue Buproprion XL as prescribed. Counseled on medication adherence/adverse effects. - Follow-up with primary provider in 3 months or sooner if needed. - buPROPion (WELLBUTRIN XL) 300 MG 24 hr tablet; TAKE 1 TABLET(300 MG) BY MOUTH DAILY  Dispense: 90 tablet; Refill: 0  5. Immunization due - Administered. - Tdap vaccine greater than or equal to 7yo IM   Patient was given clear instructions to go to Emergency Department or return to medical center if symptoms don't improve, worsen, or new problems develop.The patient verbalized understanding.  I discussed the assessment and treatment plan with the patient. The patient was provided an opportunity to ask questions and all were answered. The patient agreed with the plan and demonstrated an understanding of the instructions.   The patient was advised to call back or seek an in-person evaluation if the symptoms worsen or if the condition fails to improve as anticipated.    Ricky Stabs, NP 07/11/2023, 9:00 AM Primary Care at [facility]

## 2023-07-12 ENCOUNTER — Other Ambulatory Visit: Payer: Self-pay | Admitting: *Deleted

## 2023-07-12 LAB — BASIC METABOLIC PANEL
BUN/Creatinine Ratio: 14 (ref 9–23)
BUN: 11 mg/dL (ref 6–24)
CO2: 22 mmol/L (ref 20–29)
Calcium: 9.5 mg/dL (ref 8.7–10.2)
Chloride: 104 mmol/L (ref 96–106)
Creatinine, Ser: 0.79 mg/dL (ref 0.57–1.00)
Glucose: 122 mg/dL — ABNORMAL HIGH (ref 70–99)
Potassium: 4.3 mmol/L (ref 3.5–5.2)
Sodium: 141 mmol/L (ref 134–144)
eGFR: 86 mL/min/{1.73_m2} (ref >59)

## 2023-07-24 ENCOUNTER — Ambulatory Visit: Payer: BC Managed Care – PPO | Admitting: Family Medicine

## 2023-08-09 ENCOUNTER — Encounter: Payer: Self-pay | Admitting: Family

## 2023-08-09 ENCOUNTER — Ambulatory Visit (INDEPENDENT_AMBULATORY_CARE_PROVIDER_SITE_OTHER): Admitting: Family

## 2023-08-09 VITALS — BP 150/95 | HR 92 | Temp 98.6°F | Ht 62.5 in | Wt 160.0 lb

## 2023-08-09 DIAGNOSIS — Z7984 Long term (current) use of oral hypoglycemic drugs: Secondary | ICD-10-CM | POA: Diagnosis not present

## 2023-08-09 DIAGNOSIS — I10 Essential (primary) hypertension: Secondary | ICD-10-CM

## 2023-08-09 DIAGNOSIS — E1165 Type 2 diabetes mellitus with hyperglycemia: Secondary | ICD-10-CM

## 2023-08-09 LAB — POCT GLYCOSYLATED HEMOGLOBIN (HGB A1C): HbA1c, POC (controlled diabetic range): 6.5 % (ref 0.0–7.0)

## 2023-08-09 MED ORDER — VALSARTAN 80 MG PO TABS: 80.0000 mg | ORAL_TABLET | Freq: Every day | ORAL | 0 refills | Status: DC

## 2023-08-09 MED ORDER — METFORMIN HCL ER 500 MG PO TB24: 500.0000 mg | ORAL_TABLET | Freq: Every day | ORAL | 1 refills | Status: DC

## 2023-08-09 NOTE — Progress Notes (Signed)
 No questions or concerns to discuss.

## 2023-08-09 NOTE — Progress Notes (Signed)
 Patient ID: Pamela Chapman, female    DOB: 05/20/1965  MRN: [id number]  CC: Chronic Conditions Follow-Up  Subjective: Pamela Chapman is a 58 y.o. female who presents for chronic conditions follow-up.   Her concerns today include:  - Doing well on Valsartan, no issues/concerns. She does check blood pressure outside of office. She does not complain of red flag symptoms such as but not limited to chest pain, shortness of breath, worst headache of life, nausea/vomiting.  - Doing well on Metformin XR, no issues/concerns. Denies red flag symptoms associated with diabetes.    Patient Active Problem List   Diagnosis Date Noted   Non-recurrent acute suppurative otitis media of right ear without spontaneous rupture of tympanic membrane 12/07/2022   Mood disorder (HCC) 12/07/2022   Type 2 diabetes mellitus with diabetic dermatitis, without long-term current use of insulin (HCC) 08/23/2022   Screening mammogram for breast cancer 08/23/2022   Statin myopathy 08/23/2022   Annual physical exam 04/29/2021   Hypertension associated with diabetes (HCC) 04/29/2021   Hyperlipidemia associated with type 2 diabetes mellitus (HCC) 12/20/2019   H/O partial thyroidectomy 08/16/2017     Current Outpatient Medications on File Prior to Visit  Medication Sig Dispense Refill   buPROPion (WELLBUTRIN XL) 300 MG 24 hr tablet TAKE 1 TABLET(300 MG) BY MOUTH DAILY 90 tablet 0   No current facility-administered medications on file prior to visit.    Allergies  Allergen Reactions   Iodinated Contrast Media Anaphylaxis    All Seafood   Shellfish Allergy Anaphylaxis    All Seafood    Banana     Social History   Socioeconomic History   Marital status: Divorced    Spouse name: Not on file   Number of children: Not on file   Years of education: Not on file   Highest education level: Associate degree: academic program  Occupational History   Not on file  Tobacco Use   Smoking status: Never    Smokeless tobacco: Never  Vaping Use   Vaping status: Never Used  Substance and Sexual Activity   Alcohol use: Never   Drug use: Never   Sexual activity: Yes  Other Topics Concern   Not on file  Social History Narrative   Not on file   Social Drivers of Health   Financial Resource Strain: Low Risk  (07/11/2023)   Overall Financial Resource Strain (CARDIA)    Difficulty of Paying Living Expenses: Not very hard  Food Insecurity: No Food Insecurity (07/11/2023)   Hunger Vital Sign    Worried About Running Out of Food in the Last Year: Never true    Ran Out of Food in the Last Year: Never true  Transportation Needs: Unmet Transportation Needs (07/11/2023)   PRAPARE - Transportation    Lack of Transportation (Medical): No    Lack of Transportation (Non-Medical): Yes  Physical Activity: Insufficiently Active (07/11/2023)   Exercise Vital Sign    Days of Exercise per Week: 4 days    Minutes of Exercise per Session: 30 min  Stress: Stress Concern Present (07/11/2023)   [employer] of Occupational Health - Occupational Stress Questionnaire    Feeling of Stress : To some extent  Social Connections: Moderately Integrated (07/11/2023)   Social Connection and Isolation Panel [NHANES]    Frequency of Communication with Friends and Family: Three times a week    Frequency of Social Gatherings with Friends and Family: Once a week    Attends Religious Services: More than  4 times per year    Active Member of Clubs or Organizations: No    Attends Engineer, structural: More than 4 times per year    Marital Status: Divorced  Catering manager Violence: Not on file    Family History  Problem Relation Age of Onset   Hypertension Mother    Arthritis Mother    Hypertension Father    Thyroid cancer Father    Bone cancer Father    Alzheimer's disease Maternal Grandmother    Hypertension Maternal Grandmother    Diabetes Maternal Grandmother    Breast cancer Maternal Grandmother 58    Heart disease Maternal Grandfather     Past Surgical History:  Procedure Laterality Date   ABDOMINAL HYSTERECTOMY     BREAST BIOPSY Left 2011   neg bx   CHOLECYSTECTOMY     COLONOSCOPY WITH PROPOFOL N/A 03/02/2020   Procedure: COLONOSCOPY WITH PROPOFOL;  Surgeon: Toney Reil, MD;  Location: [facility] ENDOSCOPY;  Service: Gastroenterology;  Laterality: N/A;   THYROIDECTOMY, PARTIAL Left     ROS: Review of Systems Negative except as stated above  PHYSICAL EXAM: BP (!) 150/95   Pulse 92   Temp 98.6 F (37 C) (Oral)   Ht 5' 2.5" (1.588 m)   Wt 160 lb (72.6 kg)   SpO2 96%   BMI 28.80 kg/m   Physical Exam HENT:     Head: Normocephalic and atraumatic.     Nose: Nose normal.     Mouth/Throat:     Mouth: Mucous membranes are moist.     Pharynx: Oropharynx is clear.  Eyes:     Extraocular Movements: Extraocular movements intact.     Conjunctiva/sclera: Conjunctivae normal.     Pupils: Pupils are equal, round, and reactive to light.  Cardiovascular:     Rate and Rhythm: Normal rate and regular rhythm.     Pulses: Normal pulses.     Heart sounds: Normal heart sounds.  Pulmonary:     Effort: Pulmonary effort is normal.     Breath sounds: Normal breath sounds.  Musculoskeletal:        General: Normal range of motion.     Cervical back: Normal range of motion and neck supple.  Neurological:     General: No focal deficit present.     Mental Status: She is alert and oriented to person, place, and time.  Psychiatric:        Mood and Affect: Mood normal.        Behavior: Behavior normal.    Results for orders placed or performed in visit on 08/09/23  POCT glycosylated hemoglobin (Hb A1C)  Result Value Ref Range   Hemoglobin A1C     HbA1c POC (<> result, manual entry)     HbA1c, POC (prediabetic range)     HbA1c, POC (controlled diabetic range) 6.5 0.0 - 7.0 %    ASSESSMENT AND PLAN: 1. Primary hypertension (Primary) - Blood pressure not at goal during today's  visit. Patient asymptomatic without chest pressure, chest pain, palpitations, shortness of breath, worst headache of life, and any additional red flag symptoms. - Increase Valsartan from 40 mg to 80 mg as prescribed. - Counseled on blood pressure goal of less than 130/80, low-sodium, DASH diet, medication compliance, and 150 minutes of moderate intensity exercise per week as tolerated. Counseled on medication adherence and adverse effects. - Follow-up with primary provider in 4 weeks or sooner if needed. - valsartan (DIOVAN) 80 MG tablet; Take 1 tablet (80  mg total) by mouth daily.  Dispense: 90 tablet; Refill: 0  2. Type 2 diabetes mellitus with hyperglycemia, without long-term current use of insulin (HCC) - Hemoglobin A1c at goal at 6.5%, goal 7%.  - Continue Metformin XR as prescribed.  - Routine screening.  - Discussed the importance of healthy eating habits, low-carbohydrate diet, low-sugar diet, regular aerobic exercise (at least 150 minutes a week as tolerated) and medication compliance to achieve or maintain control of diabetes. - Follow-up with primary provider in 3 months or sooner if needed.  - POCT glycosylated hemoglobin (Hb A1C) - metFORMIN (GLUCOPHAGE-XR) 500 MG 24 hr tablet; Take 1 tablet (500 mg total) by mouth daily with breakfast.  Dispense: 30 tablet; Refill: 1 - Microalbumin / creatinine urine ratio   Patient was given the opportunity to ask questions.  Patient verbalized understanding of the plan and was able to repeat key elements of the plan. Patient was given clear instructions to go to Emergency Department or return to medical center if symptoms don't improve, worsen, or new problems develop.The patient verbalized understanding.   Orders Placed This Encounter  Procedures   Microalbumin / creatinine urine ratio   POCT glycosylated hemoglobin (Hb A1C)     Requested Prescriptions   Signed Prescriptions Disp Refills   valsartan (DIOVAN) 80 MG tablet 90 tablet 0     Sig: Take 1 tablet (80 mg total) by mouth daily.   metFORMIN (GLUCOPHAGE-XR) 500 MG 24 hr tablet 30 tablet 1    Sig: Take 1 tablet (500 mg total) by mouth daily with breakfast.    Return in about 4 weeks (around 09/06/2023) for Follow-Up or next available chronic conditions.  Rema Fendt, NP

## 2023-08-10 LAB — MICROALBUMIN / CREATININE URINE RATIO
Creatinine, Urine: 171.9 mg/dL
Microalb/Creat Ratio: 8 mg/g{creat} (ref 0–29)
Microalbumin, Urine: 13.4 ug/mL

## 2023-08-23 ENCOUNTER — Encounter: Admitting: Family

## 2023-09-08 ENCOUNTER — Ambulatory Visit: Admitting: Family

## 2023-10-12 ENCOUNTER — Ambulatory Visit: Admitting: Family

## 2023-10-14 ENCOUNTER — Other Ambulatory Visit: Payer: Self-pay | Admitting: Family

## 2023-10-14 DIAGNOSIS — F39 Unspecified mood [affective] disorder: Secondary | ICD-10-CM

## 2023-10-16 NOTE — Telephone Encounter (Signed)
 Complete

## 2023-11-05 ENCOUNTER — Other Ambulatory Visit: Payer: Self-pay | Admitting: Family

## 2023-11-05 DIAGNOSIS — I10 Essential (primary) hypertension: Secondary | ICD-10-CM

## 2023-11-06 NOTE — Telephone Encounter (Signed)
 Complete

## 2023-11-14 ENCOUNTER — Ambulatory Visit (INDEPENDENT_AMBULATORY_CARE_PROVIDER_SITE_OTHER): Admitting: Family

## 2023-11-14 ENCOUNTER — Encounter: Payer: Self-pay | Admitting: Family

## 2023-11-14 VITALS — BP 132/86 | HR 97 | Temp 98.4°F | Resp 16 | Ht 62.5 in | Wt 157.4 lb

## 2023-11-14 DIAGNOSIS — Z7984 Long term (current) use of oral hypoglycemic drugs: Secondary | ICD-10-CM | POA: Diagnosis not present

## 2023-11-14 DIAGNOSIS — J329 Chronic sinusitis, unspecified: Secondary | ICD-10-CM

## 2023-11-14 DIAGNOSIS — E1165 Type 2 diabetes mellitus with hyperglycemia: Secondary | ICD-10-CM | POA: Diagnosis not present

## 2023-11-14 DIAGNOSIS — I10 Essential (primary) hypertension: Secondary | ICD-10-CM | POA: Diagnosis not present

## 2023-11-14 MED ORDER — METFORMIN HCL ER 500 MG PO TB24: 500.0000 mg | ORAL_TABLET | Freq: Every day | ORAL | 0 refills | Status: DC

## 2023-11-14 MED ORDER — AMOXICILLIN-POT CLAVULANATE 875-125 MG PO TABS: 1.0000 | ORAL_TABLET | Freq: Two times a day (BID) | ORAL | 0 refills | Status: DC

## 2023-11-14 MED ORDER — VALSARTAN 80 MG PO TABS: 80.0000 mg | ORAL_TABLET | Freq: Every day | ORAL | 0 refills | Status: DC

## 2023-11-14 NOTE — Progress Notes (Signed)
 Follow up for b/p, patient has a rash on her arms, right side facial pain and swelling

## 2023-11-14 NOTE — Progress Notes (Signed)
 Patient ID: Pamela Chapman, female    DOB: 15-Feb-1966  MRN: [id number]  CC: Chronic Conditions Follow-Up  Subjective: Pamela Chapman is a 58 y.o. female who presents for chronic conditions follow-up.   Her concerns today include:  - Doing well on Valsartan . States intermittent tolerable mild itching of bilateral lower arms, denies red flag symptoms. Using an over the counter medication to help. Patient declined pharmacological adjustment on today. She does not complain of red flag symptoms such as but not limited to chest pain, shortness of breath, worst headache of life, nausea/vomiting.  - Doing well on Metformin  XR, no issues/concerns. Denies red flag symptoms associated with diabetes.  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 - Sinus infection of right side. Denies red flag symptoms. Taking over the counter medications with minimal relief. She declines respiratory panel and additional testing.   Patient Active Problem List   Diagnosis Date Noted   Non-recurrent acute suppurative otitis media of right ear without spontaneous rupture of tympanic membrane 12/07/2022   Mood disorder (HCC) 12/07/2022   Type 2 diabetes mellitus with diabetic dermatitis, without long-term current use of insulin (HCC) 08/23/2022   Screening mammogram for breast cancer 08/23/2022   Statin myopathy 08/23/2022   Annual physical exam 04/29/2021   Hypertension associated with diabetes (HCC) 04/29/2021   Hyperlipidemia associated with type 2 diabetes mellitus (HCC) 12/20/2019   H/O partial thyroidectomy 08/16/2017     Current Outpatient Medications on File Prior to Visit  Medication Sig Dispense Refill   buPROPion  (WELLBUTRIN  XL) 300 MG 24 hr tablet TAKE 1 TABLET(300 MG) BY MOUTH DAILY 90 tablet 0   No current facility-administered medications on file prior to visit.    Allergies  Allergen Reactions   Iodinated Contrast Media Anaphylaxis     All Seafood   Shellfish Allergy Anaphylaxis    All Seafood    Banana     Social History   Socioeconomic History   Marital status: Divorced    Spouse name: Not on file   Number of children: Not on file   Years of education: Not on file   Highest education level: Associate degree: academic program  Occupational History   Not on file  Tobacco Use   Smoking status: Never   Smokeless tobacco: Never  Vaping Use   Vaping status: Never Used  Substance and Sexual Activity   Alcohol use: Never   Drug use: Never   Sexual activity: Yes  Other Topics Concern   Not on file  Social History Narrative   Not on file   Social Drivers of Health   Financial Resource Strain: Low Risk  (07/11/2023)   Overall Financial Resource Strain (CARDIA)    Difficulty of Paying Living Expenses: Not very hard  Food Insecurity: No Food Insecurity (07/11/2023)   Hunger Vital Sign    Worried About Running Out of Food in the Last Year: Never true    Ran Out of Food in the Last Year: Never true  Transportation Needs: Unmet Transportation Needs (07/11/2023)   PRAPARE - Transportation    Lack of Transportation (Medical): No    Lack of Transportation (Non-Medical): Yes  Physical Activity: Insufficiently Active (07/11/2023)   Exercise Vital Sign    Days of Exercise per Week: 4 days    Minutes of Exercise per Session: 30 min  Stress: Stress Concern Present (07/11/2023)   [employer] of Occupational Health - Occupational Stress Questionnaire  Feeling of Stress : To some extent  Social Connections: Moderately Integrated (07/11/2023)   Social Connection and Isolation Panel    Frequency of Communication with Friends and Family: Three times a week    Frequency of Social Gatherings with Friends and Family: Once a week    Attends Religious Services: More than 4 times per year    Active Member of Clubs or Organizations: No    Attends Engineer, structural: More than 4 times per year    Marital Status:  Divorced  Catering manager Violence: Not At Risk (11/14/2023)   Humiliation, Afraid, Rape, and Kick questionnaire    Fear of Current or Ex-Partner: No    Emotionally Abused: No    Physically Abused: No    Sexually Abused: No    Family History  Problem Relation Age of Onset   Hypertension Mother    Arthritis Mother    Hypertension Father    Thyroid cancer Father    Bone cancer Father    Alzheimer's disease Maternal Grandmother    Hypertension Maternal Grandmother    Diabetes Maternal Grandmother    Breast cancer Maternal Grandmother 58   Heart disease Maternal Grandfather     Past Surgical History:  Procedure Laterality Date   ABDOMINAL HYSTERECTOMY     BREAST BIOPSY Left 2011   neg bx   CHOLECYSTECTOMY     COLONOSCOPY WITH PROPOFOL  N/A 03/02/2020   Procedure: COLONOSCOPY WITH PROPOFOL ;  Surgeon: Unk Corinn Skiff, MD;  Location: [facility];  Service: Gastroenterology;  Laterality: N/A;   THYROIDECTOMY, PARTIAL Left     ROS: Review of Systems Negative except as stated above  PHYSICAL EXAM: BP 132/86   Pulse 97   Temp 98.4 F (36.9 C) (Oral)   Resp 16   Ht 5' 2.5 (1.588 m)   Wt 157 lb 6.4 oz (71.4 kg)   SpO2 95%   BMI 28.33 kg/m   Physical Exam HENT:     Head: Normocephalic and atraumatic.     Right Ear: Tympanic membrane, ear canal and external ear normal.     Left Ear: Tympanic membrane, ear canal and external ear normal.     Nose: Nose normal.     Mouth/Throat:     Mouth: Mucous membranes are moist.     Pharynx: Oropharynx is clear.  Eyes:     Extraocular Movements: Extraocular movements intact.     Conjunctiva/sclera: Conjunctivae normal.     Pupils: Pupils are equal, round, and reactive to light.  Cardiovascular:     Rate and Rhythm: Normal rate and regular rhythm.     Pulses: Normal pulses.     Heart sounds: Normal heart sounds.  Pulmonary:     Effort: Pulmonary effort is normal.     Breath sounds: Normal breath sounds.  Musculoskeletal:         General: Normal range of motion.     Cervical back: Normal range of motion and neck supple.  Neurological:     General: No focal deficit present.     Mental Status: She is alert and oriented to person, place, and time.  Psychiatric:        Mood and Affect: Mood normal.        Behavior: Behavior normal.     ASSESSMENT AND PLAN: 1. Primary hypertension (Primary) - Continue Valsartan  as prescribed.  - Counseled on blood pressure goal of less than 130/80, low-sodium, DASH diet, medication compliance, and 150 minutes of moderate intensity exercise per week  as tolerated. Counseled on medication adherence and adverse effects. - Follow-up with primary provider in 3 months or sooner if needed.  - valsartan  (DIOVAN ) 80 MG tablet; Take 1 tablet (80 mg total) by mouth daily.  Dispense: 90 tablet; Refill: 0  2. Type 2 diabetes mellitus with hyperglycemia, without long-term current use of insulin (HCC) - Continue Metformin  XR as prescribed.  - Hemoglobin A1c result pending.  - Discussed the importance of healthy eating habits, low-carbohydrate diet, low-sugar diet, regular aerobic exercise (at least 150 minutes a week as tolerated) and medication compliance to achieve or maintain control of diabetes. Counseled on medication adherence/adverse effects.  - Follow-up with primary provider as scheduled. - metFORMIN  (GLUCOPHAGE -XR) 500 MG 24 hr tablet; Take 1 tablet (500 mg total) by mouth daily with breakfast.  Dispense: 90 tablet; Refill: 0 - Hemoglobin A1c; Future  3. Sinusitis, unspecified chronicity, unspecified location - Amoxicillin -Clavulanate as prescribed. Counseled on medication adherence/adverse effects.  - Follow-up with primary provider as scheduled. - amoxicillin -clavulanate (AUGMENTIN ) 875-125 MG tablet; Take 1 tablet by mouth 2 (two) times daily.  Dispense: 20 tablet; Refill: 0   Patient was given the opportunity to ask questions.  Patient verbalized understanding of the plan and  was able to repeat key elements of the plan. Patient was given clear instructions to go to Emergency Department or return to medical center if symptoms don't improve, worsen, or new problems develop.The patient verbalized understanding.   Orders Placed This Encounter  Procedures   Hemoglobin A1c     Requested Prescriptions   Signed Prescriptions Disp Refills   valsartan  (DIOVAN ) 80 MG tablet 90 tablet 0    Sig: Take 1 tablet (80 mg total) by mouth daily.   metFORMIN  (GLUCOPHAGE -XR) 500 MG 24 hr tablet 90 tablet 0    Sig: Take 1 tablet (500 mg total) by mouth daily with breakfast.   amoxicillin -clavulanate (AUGMENTIN ) 875-125 MG tablet 20 tablet 0    Sig: Take 1 tablet by mouth 2 (two) times daily.    Return in about 3 months (around 02/14/2024) for Follow-Up or next available chronic conditions.  Greig JINNY Drones, NP

## 2023-11-27 ENCOUNTER — Encounter: Admitting: Family

## 2024-02-06 ENCOUNTER — Other Ambulatory Visit: Payer: Self-pay | Admitting: Family Medicine

## 2024-02-06 ENCOUNTER — Ambulatory Visit: Admitting: Family Medicine

## 2024-02-06 ENCOUNTER — Encounter: Payer: Self-pay | Admitting: Family Medicine

## 2024-02-06 VITALS — BP 136/88 | HR 83 | Temp 97.9°F | Ht 62.5 in | Wt 159.4 lb

## 2024-02-06 DIAGNOSIS — Z23 Encounter for immunization: Secondary | ICD-10-CM

## 2024-02-06 DIAGNOSIS — R42 Dizziness and giddiness: Secondary | ICD-10-CM

## 2024-02-06 DIAGNOSIS — Z136 Encounter for screening for cardiovascular disorders: Secondary | ICD-10-CM | POA: Diagnosis not present

## 2024-02-06 DIAGNOSIS — Z79899 Other long term (current) drug therapy: Secondary | ICD-10-CM

## 2024-02-06 DIAGNOSIS — E1165 Type 2 diabetes mellitus with hyperglycemia: Secondary | ICD-10-CM | POA: Diagnosis not present

## 2024-02-06 DIAGNOSIS — I10 Essential (primary) hypertension: Secondary | ICD-10-CM

## 2024-02-06 LAB — LIPID PANEL
Cholesterol: 177 mg/dL (ref 0–200)
HDL: 39.3 mg/dL (ref >39.00)
LDL Cholesterol: 101 mg/dL — ABNORMAL HIGH (ref 0–99)
NonHDL: 138.12
Total CHOL/HDL Ratio: 5
Triglycerides: 186 mg/dL — ABNORMAL HIGH (ref 0.0–149.0)
VLDL: 37.2 mg/dL (ref 0.0–40.0)

## 2024-02-06 LAB — CBC WITH DIFFERENTIAL/PLATELET
Basophils Absolute: 0.1 K/uL (ref 0.0–0.1)
Basophils Relative: 1.2 % (ref 0.0–3.0)
Eosinophils Absolute: 0.2 K/uL (ref 0.0–0.7)
Eosinophils Relative: 3.5 % (ref 0.0–5.0)
HCT: 39.7 % (ref 36.0–46.0)
Hemoglobin: 12.8 g/dL (ref 12.0–15.0)
Lymphocytes Relative: 43.5 % (ref 12.0–46.0)
Lymphs Abs: 2 K/uL (ref 0.7–4.0)
MCHC: 32.2 g/dL (ref 30.0–36.0)
MCV: 77.7 fl — ABNORMAL LOW (ref 78.0–100.0)
Monocytes Absolute: 0.4 K/uL (ref 0.1–1.0)
Monocytes Relative: 7.7 % (ref 3.0–12.0)
Neutro Abs: 2.1 K/uL (ref 1.4–7.7)
Neutrophils Relative %: 44.1 % (ref 43.0–77.0)
Platelets: 310 K/uL (ref 150.0–400.0)
RBC: 5.11 Mil/uL (ref 3.87–5.11)
RDW: 16.1 % — ABNORMAL HIGH (ref 11.5–15.5)
WBC: 4.7 K/uL (ref 4.0–10.5)

## 2024-02-06 LAB — MICROALBUMIN / CREATININE URINE RATIO
Creatinine,U: 121.8 mg/dL
Microalb Creat Ratio: 9.9 mg/g (ref 0.0–30.0)
Microalb, Ur: 1.2 mg/dL (ref 0.0–1.9)

## 2024-02-06 LAB — COMPREHENSIVE METABOLIC PANEL WITH GFR
ALT: 25 U/L (ref 0–35)
AST: 26 U/L (ref 0–37)
Albumin: 4.7 g/dL (ref 3.5–5.2)
Alkaline Phosphatase: 76 U/L (ref 39–117)
BUN: 13 mg/dL (ref 6–23)
CO2: 27 meq/L (ref 19–32)
Calcium: 9.4 mg/dL (ref 8.4–10.5)
Chloride: 105 meq/L (ref 96–112)
Creatinine, Ser: 0.8 mg/dL (ref 0.40–1.20)
GFR: 80.54 mL/min (ref >60.00)
Glucose, Bld: 114 mg/dL — ABNORMAL HIGH (ref 70–99)
Potassium: 3.9 meq/L (ref 3.5–5.1)
Sodium: 139 meq/L (ref 135–145)
Total Bilirubin: 0.3 mg/dL (ref 0.2–1.2)
Total Protein: 7.6 g/dL (ref 6.0–8.3)

## 2024-02-06 LAB — HEMOGLOBIN A1C: Hgb A1c MFr Bld: 6.8 % — ABNORMAL HIGH (ref 4.6–6.5)

## 2024-02-06 MED ORDER — LOSARTAN POTASSIUM 25 MG PO TABS: 25.0000 mg | ORAL_TABLET | Freq: Every day | ORAL | 1 refills | Status: DC

## 2024-02-06 NOTE — Progress Notes (Signed)
 New Patient Visit  Subjective:     Patient ID: Pamela Chapman, female    DOB: 1965/07/23, 58 y.o.   MRN: [id number]  Chief Complaint  Patient presents with   Establish Care    Establishing Care    HPI  Discussed the use of AI scribe software for clinical note transcription with the patient, who gave verbal consent to proceed.  History of Present Illness Pamela Chapman is a 58 year old female with hypertension and prediabetes who presents with dizziness and medication management concerns.  Dizziness - Dizziness associated with valsartan  use - Dizziness present for approximately one year - Valsartan  taken inconsistently due to side effects  Antihypertensive medication intolerance - Valsartan  use complicated by itching and headaches - Inconsistent adherence to valsartan  for about one year - No home blood pressure monitoring - Blood pressure increases with consumption of two to three cups of coffee in the morning  Prediabetes - Last hemoglobin A1c was 6.6 seven months ago - Previous increase in A1c attributed to dietary habits, including Diet Coke and [employer] peanut butter cups - Manages prediabetes through dietary modifications - Not taking metformin   Fatigue and decreased energy - Occasionally takes Wellbutrin  for energy without significant improvement in energy levels or weight - No recent exercise - Fatigue attributed to continuous work without a day off since the holidays - Managing work, family, and academic commitments while pursuing a bachelor's degree in healthcare - Feels overwhelmed by current responsibilities     ROS Per HPI  Outpatient Encounter Medications as of 02/06/2024  Medication Sig   losartan (COZAAR) 25 MG tablet Take 1 tablet (25 mg total) by mouth daily.   [DISCONTINUED] valsartan  (DIOVAN ) 80 MG tablet Take 1 tablet (80 mg total) by mouth daily.   buPROPion  (WELLBUTRIN  XL) 300 MG 24 hr tablet TAKE 1 TABLET(300 MG) BY  MOUTH DAILY (Patient not taking: Reported on 02/06/2024)   [DISCONTINUED] amoxicillin -clavulanate (AUGMENTIN ) 875-125 MG tablet Take 1 tablet by mouth 2 (two) times daily. (Patient not taking: Reported on 02/06/2024)   [DISCONTINUED] metFORMIN  (GLUCOPHAGE -XR) 500 MG 24 hr tablet Take 1 tablet (500 mg total) by mouth daily with breakfast. (Patient not taking: Reported on 02/06/2024)   No facility-administered encounter medications on file as of 02/06/2024.    History reviewed. No pertinent past medical history.  Past Surgical History:  Procedure Laterality Date   ABDOMINAL HYSTERECTOMY     BREAST BIOPSY Left 2011   neg bx   CHOLECYSTECTOMY     COLONOSCOPY WITH PROPOFOL  N/A 03/02/2020   Procedure: COLONOSCOPY WITH PROPOFOL ;  Surgeon: Unk Corinn Skiff, MD;  Location: [facility] END[facility];  Service: Gastroenterology;  Laterality: N/A;   THYROIDECTOMY, PARTIAL Left     Family History  Problem Relation Age of Onset   Hypertension Mother    Arthritis Mother    Hypertension Father    Thyroid cancer Father    Bone cancer Father    Alzheimer's disease Maternal Grandmother    Hypertension Maternal Grandmother    Diabetes Maternal Grandmother    Breast cancer Maternal Grandmother 25   Heart disease Maternal Grandfather     Social History   Socioeconomic History   Marital status: Divorced    Spouse name: Not on file   Number of children: Not on file   Years of education: Not on file   Highest education level: Associate degree: academic program  Occupational History   Not on file  Tobacco Use   Smoking status: Never  Smokeless tobacco: Never  Vaping Use   Vaping status: Never Used  Substance and Sexual Activity   Alcohol use: Never   Drug use: Never   Sexual activity: Yes  Other Topics Concern   Not on file  Social History Narrative   Not on file   Social Drivers of Health   Financial Resource Strain: Low Risk  (07/11/2023)   Overall Financial Resource Strain (CARDIA)     Difficulty of Paying Living Expenses: Not very hard  Food Insecurity: No Food Insecurity (07/11/2023)   Hunger Vital Sign    Worried About Running Out of Food in the Last Year: Never true    Ran Out of Food in the Last Year: Never true  Transportation Needs: Unmet Transportation Needs (07/11/2023)   PRAPARE - Transportation    Lack of Transportation (Medical): No    Lack of Transportation (Non-Medical): Yes  Physical Activity: Insufficiently Active (07/11/2023)   Exercise Vital Sign    Days of Exercise per Week: 4 days    Minutes of Exercise per Session: 30 min  Stress: Stress Concern Present (07/11/2023)   [employer] of Occupational Health - Occupational Stress Questionnaire    Feeling of Stress : To some extent  Social Connections: Moderately Integrated (07/11/2023)   Social Connection and Isolation Panel    Frequency of Communication with Friends and Family: Three times a week    Frequency of Social Gatherings with Friends and Family: Once a week    Attends Religious Services: More than 4 times per year    Active Member of [employer] or Organizations: No    Attends Engineer, structural: More than 4 times per year    Marital Status: Divorced  Catering manager Violence: Not At Risk (11/14/2023)   Humiliation, Afraid, Rape, and Kick questionnaire    Fear of Current or Ex-Partner: No    Emotionally Abused: No    Physically Abused: No    Sexually Abused: No       Objective:    BP 136/88 (BP Location: Right Arm, Patient Position: Sitting, Cuff Size: Large)   Pulse 83   Temp 97.9 F (36.6 C)   Ht 5' 2.5 (1.588 m)   Wt 159 lb 6.4 oz (72.3 kg)   SpO2 99%   BMI 28.69 kg/m    Physical Exam Vitals and nursing note reviewed.  Constitutional:      General: She is not in acute distress.    Appearance: Normal appearance.  HENT:     Head: Normocephalic and atraumatic.     Right Ear: External ear normal.     Left Ear: External ear normal.     Nose: Nose normal.      Mouth/Throat:     Mouth: Mucous membranes are moist.     Pharynx: Oropharynx is clear.  Eyes:     Extraocular Movements: Extraocular movements intact.     Pupils: Pupils are equal, round, and reactive to light.  Cardiovascular:     Rate and Rhythm: Normal rate and regular rhythm.     Pulses: Normal pulses.     Heart sounds: Normal heart sounds.  Pulmonary:     Effort: Pulmonary effort is normal. No respiratory distress.     Breath sounds: Normal breath sounds. No wheezing, rhonchi or rales.  Musculoskeletal:        General: Normal range of motion.     Cervical back: Normal range of motion.     Right lower leg: No edema.  Left lower leg: No edema.  Lymphadenopathy:     Cervical: No cervical adenopathy.  Neurological:     General: No focal deficit present.     Mental Status: She is alert and oriented to person, place, and time.  Psychiatric:        Mood and Affect: Mood normal.        Thought Content: Thought content normal.     No results found for any visits on 02/06/24.      Assessment & Plan:   Assessment and Plan Assessment & Plan Essential hypertension, dizziness Hypertension managed with valsartan , associated with dizziness likely due to hypotension. Inconsistent valsartan  use noted. Caffeine intake elevates blood pressure. Switched to losartan for lower dosing and reduced dizziness risk. - Discontinue valsartan . - Initiate losartan at a lower dose. - Advise to reduce caffeine intake.  Type 2 diabetes mellitus with hyperglycemia, without long-term use of insulin  diet-controlled Type 2 diabetes managed with diet. Last A1c 6.6% seven months ago, indicating good control. She refuses metformin , preferring dietary management. - Continue dietary management.  Immunization due She has not received a flu shot recently and is hesitant about COVID-19 vaccination. Agreed to flu shot, declined COVID-19 vaccination. - Administer flu shot.  Encounter screening for  cardiovascular disorders Fasting labs possible as she has not eaten today. Plan to check cholesterol levels. - Order fasting lipid panel.  BMI 28 - Continue efforts in healthy diet and work on increasing activity level  Medication management - Labs today, will dose adjust as needed     Orders Placed This Encounter  Procedures   Flu vaccine trivalent PF, 6mos and older(Flulaval,Afluria,Fluarix,Fluzone)   CBC with Differential/Platelet    Release to patient:   Immediate [1]   Comprehensive metabolic panel with GFR    Release to patient:   Immediate [1]   Hemoglobin A1c   Lipid panel   Microalbumin / creatinine urine ratio    Release to patient:   Immediate     Meds ordered this encounter  Medications   losartan (COZAAR) 25 MG tablet    Sig: Take 1 tablet (25 mg total) by mouth daily.    Dispense:  30 tablet    Refill:  1    Return in about 3 months (around 05/07/2024).  Corean LITTIE Ku, FNP

## 2024-02-06 NOTE — Patient Instructions (Addendum)
 Welcome to [facility]!  Thank you for choosing us  for your Primary Care needs.   We offer in person and video appointments for your convenience. You may call our office to schedule appointments, or you may schedule appointments with me through MyChart.   The best way to get in contact with me is via MyChart message. This will get to me faster than a phone call, unless there is an emergency, then please call 911.  The lab is located downstairs in the Sports Medicine building, we also have xray available there.   We are checking labs today, will be in contact with any results that require further attention.  We have given your flu vaccine today.   Follow up with me in about 3 months for labs and medication management, sooner if needed.

## 2024-02-09 ENCOUNTER — Other Ambulatory Visit: Payer: Self-pay | Admitting: Family

## 2024-02-09 DIAGNOSIS — E1165 Type 2 diabetes mellitus with hyperglycemia: Secondary | ICD-10-CM

## 2024-02-11 ENCOUNTER — Ambulatory Visit: Payer: Self-pay | Admitting: Family Medicine

## 2024-02-11 DIAGNOSIS — E1165 Type 2 diabetes mellitus with hyperglycemia: Secondary | ICD-10-CM

## 2024-02-11 DIAGNOSIS — E1169 Type 2 diabetes mellitus with other specified complication: Secondary | ICD-10-CM

## 2024-02-11 MED ORDER — METFORMIN HCL 500 MG PO TABS: 500.0000 mg | ORAL_TABLET | Freq: Every day | ORAL | 3 refills | Status: AC

## 2024-02-11 MED ORDER — ROSUVASTATIN CALCIUM 10 MG PO TABS
10.0000 mg | ORAL_TABLET | Freq: Every day | ORAL | 1 refills | Status: AC
Start: 2024-02-11 — End: ?

## 2024-05-07 ENCOUNTER — Ambulatory Visit: Admitting: Family Medicine

## 2024-05-16 ENCOUNTER — Encounter: Payer: Self-pay | Admitting: Family Medicine

## 2024-05-16 ENCOUNTER — Ambulatory Visit (INDEPENDENT_AMBULATORY_CARE_PROVIDER_SITE_OTHER): Payer: Self-pay | Admitting: Family Medicine

## 2024-05-16 VITALS — BP 126/84 | HR 95 | Temp 98.1°F | Ht 62.5 in | Wt 161.8 lb

## 2024-05-16 DIAGNOSIS — M5442 Lumbago with sciatica, left side: Secondary | ICD-10-CM | POA: Diagnosis not present

## 2024-05-16 DIAGNOSIS — Z7984 Long term (current) use of oral hypoglycemic drugs: Secondary | ICD-10-CM | POA: Diagnosis not present

## 2024-05-16 DIAGNOSIS — G8929 Other chronic pain: Secondary | ICD-10-CM

## 2024-05-16 DIAGNOSIS — M542 Cervicalgia: Secondary | ICD-10-CM

## 2024-05-16 DIAGNOSIS — E785 Hyperlipidemia, unspecified: Secondary | ICD-10-CM

## 2024-05-16 DIAGNOSIS — E1169 Type 2 diabetes mellitus with other specified complication: Secondary | ICD-10-CM

## 2024-05-16 DIAGNOSIS — E1165 Type 2 diabetes mellitus with hyperglycemia: Secondary | ICD-10-CM

## 2024-05-16 DIAGNOSIS — Z79899 Other long term (current) drug therapy: Secondary | ICD-10-CM | POA: Diagnosis not present

## 2024-05-16 DIAGNOSIS — G720 Drug-induced myopathy: Secondary | ICD-10-CM

## 2024-05-16 DIAGNOSIS — M5441 Lumbago with sciatica, right side: Secondary | ICD-10-CM | POA: Diagnosis not present

## 2024-05-16 DIAGNOSIS — I10 Essential (primary) hypertension: Secondary | ICD-10-CM | POA: Diagnosis not present

## 2024-05-16 NOTE — Addendum Note (Signed)
 Addended by: Shelba Susi L on: 05/16/2024 12:33 PM   Modules accepted: Orders

## 2024-05-16 NOTE — Progress Notes (Signed)
 "  Established Patient Office Visit  Subjective:     Patient ID: Pamela Chapman, female    DOB: [birth year], 59 y.o.   MRN: [id number]  Chief Complaint  Patient presents with   Follow-up    HPI  Discussed the use of AI scribe software for clinical note transcription with the patient, who gave verbal consent to proceed.  History of Present Illness Pamela Chapman is a 59 year old female with sciatica and a C2 fracture who presents with worsening pain management issues.  Neuropathic and musculoskeletal pain - Worsening right-sided pain attributed to sciatica and C2 cervical fracture - Pain described as persistent, with tightness and difficulty moving - Pain began as constant shooting pain radiating up and down the legs, later involving arms and back - Requires daily use of Advil, Tylenol, and ibuprofen, often taken at 5 AM for relief - Symptoms have persisted and worsened over the past month - MRI confirmed sciatica and C2 fracture - Declined surgical intervention, prefers medication-based management  Insomnia and sleep disturbance - Significant insomnia with sleep onset typically at 4-5 AM - Sleep duration limited to 1-2 hours per night - Attempting to shift to a more typical sleep schedule since graduation, but remains unable to sleep at night  Elevated blood pressure - Elevated blood pressure readings noted - Associates elevated blood pressure with poor dietary choices     ROS Per HPI      Objective:    BP 126/84   Pulse 95   Temp 98.1 F (36.7 C) (Temporal)   Ht 5' 2.5 (1.588 m)   Wt 161 lb 12.8 oz (73.4 kg)   SpO2 99%   BMI 29.12 kg/m    Physical Exam Vitals and nursing note reviewed.  Constitutional:      General: She is not in acute distress.    Appearance: Normal appearance.  HENT:     Head: Normocephalic and atraumatic.     Right Ear: External ear normal.     Left Ear: External ear normal.     Nose: Nose normal.      Mouth/Throat:     Mouth: Mucous membranes are moist.     Pharynx: Oropharynx is clear.  Eyes:     Extraocular Movements: Extraocular movements intact.     Pupils: Pupils are equal, round, and reactive to light.  Cardiovascular:     Rate and Rhythm: Normal rate and regular rhythm.     Pulses: Normal pulses.     Heart sounds: Normal heart sounds.  Pulmonary:     Effort: Pulmonary effort is normal. No respiratory distress.     Breath sounds: Normal breath sounds. No wheezing, rhonchi or rales.  Musculoskeletal:        General: Normal range of motion.     Cervical back: Normal range of motion.     Right lower leg: No edema.     Left lower leg: No edema.  Lymphadenopathy:     Cervical: No cervical adenopathy.  Neurological:     General: No focal deficit present.     Mental Status: She is alert and oriented to person, place, and time.  Psychiatric:        Mood and Affect: Mood normal.        Thought Content: Thought content normal.     No results found for any visits on 05/16/24.  The 10-year ASCVD risk score (Arnett DK, et al., 2019) is: 15.5%  BP Readings from Last 3 Encounters:  05/16/24 126/84  02/06/24 136/88  11/14/23 132/86   Wt Readings from Last 3 Encounters:  05/16/24 161 lb 12.8 oz (73.4 kg)  02/06/24 159 lb 6.4 oz (72.3 kg)  11/14/23 157 lb 6.4 oz (71.4 kg)      Last CBC Lab Results  Component Value Date   WBC 4.7 02/06/2024   HGB 12.8 02/06/2024   HCT 39.7 02/06/2024   MCV 77.7 (L) 02/06/2024   MCH 24.7 (L) 08/23/2022   RDW 16.1 (H) 02/06/2024   PLT 310.0 02/06/2024   Last metabolic panel Lab Results  Component Value Date   GLUCOSE 114 (H) 02/06/2024   NA 139 02/06/2024   K 3.9 02/06/2024   CL 105 02/06/2024   CO2 27 02/06/2024   BUN 13 02/06/2024   CREATININE 0.80 02/06/2024   GFR 80.54 02/06/2024   CALCIUM  9.4 02/06/2024   PROT 7.6 02/06/2024   ALBUMIN 4.7 02/06/2024   LABGLOB 3.6 08/23/2022   AGRATIO 1.3 08/23/2022   BILITOT 0.3  02/06/2024   ALKPHOS 76 02/06/2024   AST 26 02/06/2024   ALT 25 02/06/2024   ANIONGAP 11 03/22/2022   Last lipids Lab Results  Component Value Date   CHOL 177 02/06/2024   HDL 39.30 02/06/2024   LDLCALC 101 (H) 02/06/2024   TRIG 186.0 (H) 02/06/2024   CHOLHDL 5 02/06/2024   Last hemoglobin A1c Lab Results  Component Value Date   HGBA1C 6.8 (H) 02/06/2024   Last thyroid functions Lab Results  Component Value Date   TSH 1.880 08/23/2022   FREET4 1.22 08/23/2022   THYROIDAB 9 10/15/2019   Last vitamin D No results found for: 25OHVITD2, 25OHVITD3, VD25OH Last vitamin B12 and Folate No results found for: VITAMINB12, FOLATE       Assessment & Plan:   Assessment and Plan Assessment & Plan Type 2 diabetes mellitus with hyperglycemia without long term use of  insulin, current long-term use of hypoglycemic drugs Recent dietary indiscretion with sweets. Weight stable. - chronic, continue metformin   Essential hypertension Blood pressure improved to 126/84 mmHg. Dietary indiscretion noted. - Chronic, controlled with losartan   Mixed hyperlipidemia - Chronic, stable - Continue rosuvastatin   Chronic bilateral low back pain with bilateral sciatica, chronic neck pain  history of C2 vertebral fracture Pain exacerbated by weather. Prefers conservative management. Physical therapy discussed. - Continue dual Advil for pain management. - Consider physical therapy if pain worsens.  Medication Management - labs today, will dose adjust medications as indicated      Orders Placed This Encounter  Procedures   Comprehensive metabolic panel with GFR    Release to patient:   Immediate [1]   Hemoglobin A1c     No orders of the defined types were placed in this encounter.   Return in about 6 months (around 11/13/2024) for labs OV, physical.  Corean LITTIE Ku, FNP   "

## 2024-05-16 NOTE — Patient Instructions (Addendum)
 Continue current medication regimen.   Follow up with specialists as scheduled.   We are checking labs today, will be in contact with any results that require further attention.  Follow-up with me in 6 mos for physical, sooner if needed.

## 2024-05-17 ENCOUNTER — Ambulatory Visit: Payer: Self-pay | Admitting: Family Medicine

## 2024-05-17 LAB — COMPREHENSIVE METABOLIC PANEL WITH GFR
ALT: 26 IU/L (ref 0–32)
AST: 23 IU/L (ref 0–40)
Albumin: 4.7 g/dL (ref 3.8–4.9)
Alkaline Phosphatase: 108 IU/L (ref 49–135)
BUN/Creatinine Ratio: 12 (ref 9–23)
BUN: 10 mg/dL (ref 6–24)
Bilirubin Total: 0.2 mg/dL (ref 0.0–1.2)
CO2: 24 mmol/L (ref 20–29)
Calcium: 10 mg/dL (ref 8.7–10.2)
Chloride: 103 mmol/L (ref 96–106)
Creatinine, Ser: 0.83 mg/dL (ref 0.57–1.00)
Globulin, Total: 2.8 g/dL (ref 1.5–4.5)
Glucose: 101 mg/dL — ABNORMAL HIGH (ref 70–99)
Potassium: 4.5 mmol/L (ref 3.5–5.2)
Sodium: 138 mmol/L (ref 134–144)
Total Protein: 7.5 g/dL (ref 6.0–8.5)
eGFR: 81 mL/min/1.73

## 2024-05-17 LAB — HEMOGLOBIN A1C
Est. average glucose Bld gHb Est-mCnc: 134 mg/dL
Hgb A1c MFr Bld: 6.3 % — ABNORMAL HIGH (ref 4.8–5.6)
